# Patient Record
Sex: Male | Born: 1980 | Race: Black or African American | Hispanic: No | Marital: Married | Smoking: Current every day smoker
Health system: Southern US, Community
[De-identification: ages and names within clinical notes are randomized; demographics above are authoritative.]

## PROBLEM LIST (undated history)

## (undated) HISTORY — PX: KNEE SURGERY: SHX244

---

## 2005-09-30 ENCOUNTER — Inpatient Hospital Stay: Payer: Self-pay | Admitting: Unknown Physician Specialty

## 2006-05-07 ENCOUNTER — Emergency Department: Payer: Self-pay | Admitting: Emergency Medicine

## 2006-05-08 ENCOUNTER — Emergency Department: Payer: Self-pay | Admitting: Unknown Physician Specialty

## 2006-08-07 ENCOUNTER — Emergency Department: Payer: Self-pay | Admitting: Unknown Physician Specialty

## 2007-05-14 ENCOUNTER — Emergency Department: Payer: Self-pay | Admitting: Emergency Medicine

## 2008-11-30 ENCOUNTER — Emergency Department: Payer: Self-pay | Admitting: Emergency Medicine

## 2010-01-14 ENCOUNTER — Emergency Department (HOSPITAL_COMMUNITY): Admission: EM | Admit: 2010-01-14 | Discharge: 2010-01-15 | Payer: Self-pay | Admitting: Emergency Medicine

## 2010-01-15 ENCOUNTER — Encounter: Payer: Self-pay | Admitting: Orthopedic Surgery

## 2010-01-30 ENCOUNTER — Encounter (INDEPENDENT_AMBULATORY_CARE_PROVIDER_SITE_OTHER): Payer: Self-pay | Admitting: *Deleted

## 2010-03-18 ENCOUNTER — Ambulatory Visit: Payer: Self-pay | Admitting: Orthopedic Surgery

## 2010-03-18 DIAGNOSIS — M24469 Recurrent dislocation, unspecified knee: Secondary | ICD-10-CM

## 2010-03-18 DIAGNOSIS — S83509A Sprain of unspecified cruciate ligament of unspecified knee, initial encounter: Secondary | ICD-10-CM

## 2010-03-18 DIAGNOSIS — M23302 Other meniscus derangements, unspecified lateral meniscus, unspecified knee: Secondary | ICD-10-CM

## 2010-03-19 ENCOUNTER — Encounter: Payer: Self-pay | Admitting: Orthopedic Surgery

## 2010-03-20 ENCOUNTER — Ambulatory Visit (HOSPITAL_COMMUNITY): Admission: RE | Admit: 2010-03-20 | Discharge: 2010-03-20 | Payer: Self-pay | Admitting: Orthopedic Surgery

## 2010-03-27 ENCOUNTER — Ambulatory Visit: Payer: Self-pay | Admitting: Orthopedic Surgery

## 2010-03-27 DIAGNOSIS — IMO0002 Reserved for concepts with insufficient information to code with codable children: Secondary | ICD-10-CM

## 2010-03-27 DIAGNOSIS — S83289A Other tear of lateral meniscus, current injury, unspecified knee, initial encounter: Secondary | ICD-10-CM

## 2010-04-26 ENCOUNTER — Ambulatory Visit (HOSPITAL_COMMUNITY): Admission: RE | Admit: 2010-04-26 | Discharge: 2010-04-26 | Payer: Self-pay | Admitting: Orthopedic Surgery

## 2010-04-26 ENCOUNTER — Ambulatory Visit: Payer: Self-pay | Admitting: Orthopedic Surgery

## 2010-04-30 ENCOUNTER — Ambulatory Visit: Payer: Self-pay | Admitting: Orthopedic Surgery

## 2010-05-01 ENCOUNTER — Encounter (HOSPITAL_COMMUNITY): Admission: RE | Admit: 2010-05-01 | Discharge: 2010-05-31 | Payer: Self-pay | Admitting: Orthopedic Surgery

## 2010-05-01 ENCOUNTER — Encounter: Payer: Self-pay | Admitting: Orthopedic Surgery

## 2010-05-09 ENCOUNTER — Ambulatory Visit: Payer: Self-pay | Admitting: Orthopedic Surgery

## 2010-05-14 ENCOUNTER — Encounter: Payer: Self-pay | Admitting: Orthopedic Surgery

## 2010-06-06 ENCOUNTER — Ambulatory Visit: Payer: Self-pay | Admitting: Orthopedic Surgery

## 2010-06-10 ENCOUNTER — Encounter (HOSPITAL_COMMUNITY)
Admission: RE | Admit: 2010-06-10 | Discharge: 2010-07-10 | Payer: Self-pay | Source: Home / Self Care | Admitting: Orthopedic Surgery

## 2010-06-27 ENCOUNTER — Encounter: Payer: Self-pay | Admitting: Orthopedic Surgery

## 2010-07-11 ENCOUNTER — Encounter (HOSPITAL_COMMUNITY)
Admission: RE | Admit: 2010-07-11 | Discharge: 2010-08-10 | Payer: Self-pay | Source: Home / Self Care | Admitting: Orthopedic Surgery

## 2010-07-17 ENCOUNTER — Encounter: Payer: Self-pay | Admitting: Orthopedic Surgery

## 2010-07-18 ENCOUNTER — Ambulatory Visit: Payer: Self-pay | Admitting: Orthopedic Surgery

## 2010-07-21 ENCOUNTER — Emergency Department (HOSPITAL_COMMUNITY): Admission: EM | Admit: 2010-07-21 | Discharge: 2010-07-21 | Payer: Self-pay | Admitting: Emergency Medicine

## 2010-08-14 ENCOUNTER — Emergency Department (HOSPITAL_COMMUNITY)
Admission: EM | Admit: 2010-08-14 | Discharge: 2010-08-14 | Payer: Self-pay | Source: Home / Self Care | Admitting: Emergency Medicine

## 2010-08-15 ENCOUNTER — Encounter (HOSPITAL_COMMUNITY)
Admission: RE | Admit: 2010-08-15 | Discharge: 2010-09-14 | Payer: Self-pay | Source: Home / Self Care | Attending: Orthopedic Surgery | Admitting: Orthopedic Surgery

## 2010-08-18 ENCOUNTER — Encounter: Payer: Self-pay | Admitting: Orthopedic Surgery

## 2010-09-12 ENCOUNTER — Emergency Department (HOSPITAL_COMMUNITY)
Admission: EM | Admit: 2010-09-12 | Discharge: 2010-09-13 | Payer: Self-pay | Source: Home / Self Care | Admitting: Emergency Medicine

## 2010-09-12 LAB — RAPID STREP SCREEN (MED CTR MEBANE ONLY): Streptococcus, Group A Screen (Direct): NEGATIVE

## 2010-10-02 ENCOUNTER — Ambulatory Visit
Admission: RE | Admit: 2010-10-02 | Discharge: 2010-10-02 | Payer: Self-pay | Source: Home / Self Care | Attending: Orthopedic Surgery | Admitting: Orthopedic Surgery

## 2010-10-02 ENCOUNTER — Encounter: Payer: Self-pay | Admitting: Orthopedic Surgery

## 2010-10-08 NOTE — Miscellaneous (Signed)
Summary: PT Initial eval  PT Initial eval   Imported By: Cammie Sickle 05/21/2010 19:21:15  _____________________________________________________________________  External Attachment:    Type:   Image     Comment:   External Document

## 2010-10-08 NOTE — Miscellaneous (Signed)
Summary: Rehab Report  Rehab Report   Imported By: Cammie Sickle 08/02/2010 14:07:54  _____________________________________________________________________  External Attachment:    Type:   Image     Comment:   External Document

## 2010-10-08 NOTE — Assessment & Plan Note (Signed)
Summary: POST OP/RE-CHECK/ACL SURG 04/26/10/[facility] DISC/[facility]F   Visit Type:  Follow-up Referring Matthew Baxter:  self Primary Matthew Baxter:  NA  CC:  recheck post op knee.  History of Present Illness: I saw Matthew Baxter in the office today for a followup visit.  He is a 30 years old man with the complaint of:  post op #3, left knee  DOD 04-26-10.  Left ACL reconstruction with a bone patellar tendon bone autograft; partial lateral meniscectomy, posterior horn; abrasion medial meniscal tear which was stable; and a chondroplasty of the medial femoral condyle.  6 week postop visit Medications: Norco 10/325, Phenergan 25 mg and Ibuprofen 800 mg.  Today is 4 week recheck after PT with xrays.  PT eval in EMR.  overall physical therapy doing well despite missing a couple of days.  He does have some pain at night when he is at rest otherwise he is doing well he says he is happy with his knee.  He is postop brace on range 0-90.  X-rays are done today 2 views and fixation appears to be intact the tunnels appear to be in good position. Impression normal postoperative appearance of the LEFT ACL reconstruction  Plan patient can remove his brace is not to play any sports he should continue his physical therapy and follow with me in 6 weeks     Allergies: 1)  ! Penicillin   Impression & Recommendations:  Problem # 1:  AFTERCARE FOLLOW SURGERY MUSCULOSKEL SYSTEM NEC (ICD-V58.78)  Orders: Post-Op Check (29562)  Problem # 2:  TEAR A C L (ICD-844.2)  Orders: Post-Op Check (13086)  Patient Instructions: 1)  6 weeks follow up  2)  brace optional  3)  No sports

## 2010-10-08 NOTE — Letter (Signed)
Summary: Patient medication dischaarge instructions  Patient medication dischaarge instructions   Imported By: Jacklynn Ganong 05/02/2010 15:42:44  _____________________________________________________________________  External Attachment:    Type:   Image     Comment:   External Document

## 2010-10-08 NOTE — Assessment & Plan Note (Signed)
Summary: POST OP 1/LT KNEE ACL SURG 04/26/10/[facility] DISCOUNT/[facility]F   Visit Type:  post op Referring Provider:  self Primary Provider:  NA  CC:  left knee post op.  History of Present Illness: I saw Matthew Baxter in the office today for a followup visit.  He is a 30 years old man with the complaint of:  post op #1, left knee.  DOD 04-26-10.  Left ACL reconstruction with a bone patellar tendon bone autograft; partial lateral meniscectomy, posterior horn; abrasion medial meniscal tear which was stable; and a chondroplasty of the medial femoral condyle.  Medications: Norco 10/325, Phenergan 25 mg and Ibuprofen 800 mg.  Wounds look good   minimal swelling  T scope brace applied     Allergies: 1)  ! Penicillin   Impression & Recommendations:  Problem # 1:  TEAR MEDIAL MENISCUS (ICD-836.0) Assessment Comment Only  Orders: Post-Op Check (16109)  Problem # 2:  TEAR LATERAL MENISCUS (ICD-836.1) Assessment: Comment Only  Orders: Post-Op Check (60454)  Problem # 3:  TEAR A C L (ICD-844.2) Assessment: Comment Only  Orders: Post-Op Check (09811)  Patient Instructions: 1)  start therapy  2)  return  Sept 1 staples out

## 2010-10-08 NOTE — Letter (Signed)
Summary: surgery order LT knee  surgery order LT knee   Imported By: Cammie Sickle 04/02/2010 18:17:37  _____________________________________________________________________  External Attachment:    Type:   Image     Comment:   External Document

## 2010-10-08 NOTE — Miscellaneous (Signed)
Summary: PT clinical evaluation  PT clinical evaluation   Imported By: Jacklynn Ganong 05/17/2010 10:20:30  _____________________________________________________________________  External Attachment:    Type:   Image     Comment:   External Document

## 2010-10-08 NOTE — Assessment & Plan Note (Signed)
Summary: POST OP 2/REMOVE STAPLES/SURG 04/26/10/[facility]C/[facility]F   Visit Type:  post op Referring Provider:  self Primary Provider:  NA  CC:  left knee.  History of Present Illness: I saw Matthew Baxter in the office today for a followup visit.  He is a 30 years old man with the complaint of:  post op #2, left knee  Staples out today.  DOD 04-26-10.  Left ACL reconstruction with a bone patellar tendon bone autograft; partial lateral meniscectomy, posterior horn; abrasion medial meniscal tear which was stable; and a chondroplasty of the medial femoral condyle.  Medications: Norco 10/325, Phenergan 25 mg and Ibuprofen 800 mg.  Staples were removed. Incision looks good. He has 0-90 of flexion.  Brace reapplied. Patient to continue therapy. Followup in 4 weeks for 1st postopAcl xrays   Allergies: 1)  ! Penicillin   Impression & Recommendations:  Problem # 1:  TEAR MEDIAL MENISCUS (ICD-836.0)  Orders: Post-Op Check (13086)  Problem # 2:  TEAR LATERAL MENISCUS (ICD-836.1)  Orders: Post-Op Check (57846)  Problem # 3:  TEAR A C L (ICD-844.2)  Orders: Post-Op Check (96295)  Problem # 4:  AFTERCARE FOLLOW SURGERY MUSCULOSKEL SYSTEM NEC (ICD-V58.78) Assessment: Improved  Orders: Post-Op Check (28413)  Patient Instructions: 1)  4 weeks come back  2)  continue physical therapy  3)  continue brace until you come back

## 2010-10-08 NOTE — Letter (Signed)
Summary: *Orthopedic No Show Letter  [facility]  [address]  [place], [place] [address]   Phone: [phone]  Fax: [phone]      01/30/2010    Mr. Devontae Casasola [address] [place], [place]  [address]    Dear Mr. KANAAN,   Our records indicate that you missed your scheduled appointment with Dr. Beaulah Corin on 01/28/10, following your Emergency Room visit at [facility].   Please contact this office to reschedule your appointment as soon as possible.  It is important that you keep your scheduled appointments with your physician, so we can provide you the best care possible.        Sincerely,   Dr. Terrance Mass, MD [facility] Phone [phone]

## 2010-10-08 NOTE — Assessment & Plan Note (Signed)
Summary: 6 wek reck/post op acl surg 04/26/10/[facility] disc/bsf   Visit Type:  Follow-up Referring Provider:  self Primary Provider:  NA   History of Present Illness: I saw Matthew Baxter in the office today for a 6 week followup visit.  He is a 30 years old man with the complaint of:  increased left knee pain   DOD 04-26-10.  Left ACL reconstruction with a bone patellar tendon bone autograft; partial lateral meniscectomy, posterior horn; abrasion medial meniscal tear which was stable; and a chondroplasty of the medial femoral condyle.  11 6/7days week post op visit   Medications: Norco 10/325, Phenergan 25 mg   He had 2 episodes of knee popping one while in bed going from flexion to extension; and the other walking in the [place]   The therapy is progressing on target   Allergies: 1)  ! Penicillin   Knee Exam  Knee Exam:    Left:    Inspection:  Normal    Palpation:  Normal    Tenderness:  no    Swelling:  no    Range of Motion:       Flexion-Active: full       Extension-Active: full  Anterior drawer:    Left negative Lachman :    Left negative Pivot:    Left negative  McMurrays negative left knee   Impression & Recommendations:  Problem # 1:  AFTERCARE FOLLOW SURGERY MUSCULOSKEL SYSTEM NEC (ICD-V58.78) Assessment Comment Only  Orders: Post-Op Check (64332)  Problem # 2:  TEAR MEDIAL MENISCUS (ICD-836.0) Assessment: Comment Only  Orders: Post-Op Check (95188)  Problem # 3:  TEAR LATERAL MENISCUS (ICD-836.1) Assessment: Comment Only  The knee seems fine. Perhaps there may be some scar tissue that popped.   Orders: Post-Op Check [phone])  Medications Added to Medication List This Visit: 1)  Norco 7.5-325 Mg Tabs (Hydrocodone-acetaminophen) .Marland Kitchen.. 1 q 6 as needed pain 2)  Promethazine Hcl 25 Mg Tabs (Promethazine hcl) .Marland Kitchen.. 1 by mouth q 6 as needed nausea  Patient Instructions: 1)  Please schedule a follow-up appointment in 3 months. Prescriptions: PROMETHAZINE  HCL 25 MG TABS (PROMETHAZINE HCL) 1 by mouth q 6 as needed nausea  #56 x 2   Entered and Authorized by:   Fuller Canada MD   Signed by:   Fuller Canada MD on 07/18/2010   Method used:   Print then Give to Patient   RxID:   [id number] NORCO 7.5-325 MG TABS (HYDROCODONE-ACETAMINOPHEN) 1 q 6 as needed pain  #56 x 2   Entered and Authorized by:   Fuller Canada MD   Signed by:   Fuller Canada MD on 07/18/2010   Method used:   Print then Give to Patient   RxID:   [id number]    Orders Added: 1)  Post-Op Check [31517]
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
## 2010-10-08 NOTE — Miscellaneous (Signed)
Summary: mri appt 03/20/10 reg at 230pm  Clinical Lists Changes  has [facility] percent [facility], no precert needed, LMOM of appt, advised to call here to make appt to go over results. Patient has scheduled appt for 03/27/10.

## 2010-10-08 NOTE — Letter (Signed)
Summary: Out of [facility]  [address]  [place], [place] [address]   Phone: [phone]  Fax: [phone]    April 30, 2010   Patient:  Matthew Baxter    To Whom It May Concern:   For Medical reasons, please Note:  Apppointment in our office today/may return to school  Start:   April 30, 2010   End:    April 30, 2010  If you need additional information, please feel free to contact our office.   Sincerely,    Terrance Mass, MD    ****This is a legal document and cannot be tampered with.  Schools are authorized to verify all information and to do so accordingly.

## 2010-10-08 NOTE — Letter (Signed)
Summary: History form  History form   Imported By: Jacklynn Ganong 03/25/2010 07:57:32  _____________________________________________________________________  External Attachment:    Type:   Image     Comment:   External Document
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
## 2010-10-08 NOTE — Miscellaneous (Signed)
Summary: Rehab Report  Rehab Report   Imported By: Jacklynn Ganong 07/03/2010 15:02:35  _____________________________________________________________________  External Attachment:    Type:   Image     Comment:   External Document

## 2010-10-10 NOTE — Miscellaneous (Signed)
Summary: Rehab Report  Rehab Report   Imported By: Cammie Sickle 09/10/2010 17:51:50  _____________________________________________________________________  External Attachment:    Type:   Image     Comment:   External Document

## 2010-10-10 NOTE — Assessment & Plan Note (Signed)
Summary: 4 M RE-CK LT KNEE/POST OP ACL SURG 04/26/10/[facility] DISCOUNT/CAF   Visit Type:  Follow-up Referring Provider:  self Primary Provider:  NA  CC:  recheck left knee.  History of Present Illness:   30 year old male, status post LEFT ACL reconstruction. This is postop month #5.  Primary complaint is popping at about 30 of flexion going into extension.  DOD 04-26-10.  Left ACL reconstruction with a bone patellar tendon bone autograft; partial lateral meniscectomy, posterior horn; abrasion medial meniscal tear which was stable; and a chondroplasty of the medial femoral condyle.  Medications: Norco 7.5, 2 in am, 2 at night, needs refill./also takes ibuprofen   He does not have any swelling or stiffness. As a pain level of 2/10. He is working as a Curator and a Glass blower/designer.         Allergies: 1)  ! Penicillin  Physical Exam  Additional Exam:  LEFT knee. Mild joint effusion is noted. Incision has healed nicely. He does have some lateral incisional decreased sensation.  His range of motion is full. His Lachman test is grade 0. Collateral ligaments are stable. McMurray sign was negative. Quadriceps strength is normal. He does come to full extension. Pulses and perfusion are normal.  He walks without a limp.   Impression & Recommendations:  Problem # 1:  TEAR A C L (ICD-844.2) Assessment Comment Only  Orders: Est. Patient Level III (04540)  Problem # 2:  TEAR MEDIAL MENISCUS (ICD-836.0) Assessment: Comment Only  Orders: Est. Patient Level III (98119)  Problem # 3:  TEAR LATERAL MENISCUS (ICD-836.1) Assessment: Comment Only  Orders: Est. Patient Level III (14782)  He is doing well continue as he is doing but take ibuprofen 3 times a day, and Norco 5 mg q.6 as needed for pain   Medications Added to Medication List This Visit: 1)  Norco 5-325 Mg Tabs (Hydrocodone-acetaminophen) .Marland Kitchen.. 1 by mouth q 6 hrs as needed pain 2)  Ibuprofen 800 Mg Tabs (Ibuprofen)  .Marland Kitchen.. 1 by mouth tid  Patient Instructions: 1)  Take ibuprofen 800 mg three times a day  2)  Take norco 5 mg q 6 hrs as needed  3)  f/u 3 months  Prescriptions: IBUPROFEN 800 MG TABS (IBUPROFEN) 1 by mouth tid  #90 x 2   Entered and Authorized by:   Fuller Canada MD   Signed by:   Fuller Canada MD on 10/02/2010   Method used:   Print then Give to Patient   RxID:   [id number] NORCO 5-325 MG TABS (HYDROCODONE-ACETAMINOPHEN) 1 by mouth q 6 hrs as needed pain  #84 x 2   Entered and Authorized by:   Fuller Canada MD   Signed by:   Fuller Canada MD on 10/02/2010   Method used:   Print then Give to Patient   RxID:   [id number]    Orders Added: 1)  Est. Patient Level III [25366]

## 2010-10-10 NOTE — Letter (Signed)
Summary: Out of Work  [facility]  [address]  [place], [place] [address]   Phone: [phone]  Fax: [phone]    October 02, 2010   Employee:  Matthew Baxter    To Whom It May Concern:   For Medical reasons, please excuse the above named employee from work for the following dates:  Start:   10/02/10 - patient/employee seen in our office for an appointment this morning  End/Return to work full duty no restrictions:   10/02/10  If you need additional information, please feel free to contact our office.         Sincerely,    Terrance Mass, MD

## 2010-11-22 LAB — SURGICAL PCR SCREEN
MRSA, PCR: NEGATIVE
Staphylococcus aureus: POSITIVE — AB

## 2010-11-22 LAB — HEMOGLOBIN AND HEMATOCRIT, BLOOD
HCT: 43.8 % (ref 39.0–52.0)
Hemoglobin: 14.9 g/dL (ref 13.0–17.0)

## 2010-12-24 ENCOUNTER — Emergency Department (HOSPITAL_COMMUNITY)
Admission: EM | Admit: 2010-12-24 | Discharge: 2010-12-24 | Disposition: A | Discharge status: Home / Self Care | Payer: Self-pay | Attending: Emergency Medicine | Admitting: Emergency Medicine

## 2010-12-24 DIAGNOSIS — S81009A Unspecified open wound, unspecified knee, initial encounter: Secondary | ICD-10-CM | POA: Insufficient documentation

## 2010-12-24 DIAGNOSIS — W268XXA Contact with other sharp object(s), not elsewhere classified, initial encounter: Secondary | ICD-10-CM | POA: Insufficient documentation

## 2010-12-24 DIAGNOSIS — Y9269 Other specified industrial and construction area as the place of occurrence of the external cause: Secondary | ICD-10-CM | POA: Insufficient documentation

## 2011-01-01 ENCOUNTER — Encounter: Payer: Self-pay | Admitting: Orthopedic Surgery

## 2011-01-01 ENCOUNTER — Ambulatory Visit: Payer: Self-pay | Admitting: Orthopedic Surgery

## 2011-01-07 ENCOUNTER — Emergency Department (HOSPITAL_COMMUNITY)
Admission: EM | Admit: 2011-01-07 | Discharge: 2011-01-07 | Disposition: A | Discharge status: Home / Self Care | Payer: Self-pay | Attending: Emergency Medicine | Admitting: Emergency Medicine

## 2011-01-07 DIAGNOSIS — F17200 Nicotine dependence, unspecified, uncomplicated: Secondary | ICD-10-CM | POA: Insufficient documentation

## 2011-01-07 DIAGNOSIS — Z4802 Encounter for removal of sutures: Secondary | ICD-10-CM | POA: Insufficient documentation

## 2011-02-21 ENCOUNTER — Emergency Department (HOSPITAL_COMMUNITY): Payer: Self-pay

## 2011-02-21 ENCOUNTER — Emergency Department (HOSPITAL_COMMUNITY)
Admission: EM | Admit: 2011-02-21 | Discharge: 2011-02-21 | Disposition: A | Discharge status: Home / Self Care | Payer: Self-pay | Attending: Emergency Medicine | Admitting: Emergency Medicine

## 2011-02-21 DIAGNOSIS — W230XXA Caught, crushed, jammed, or pinched between moving objects, initial encounter: Secondary | ICD-10-CM | POA: Insufficient documentation

## 2011-02-21 DIAGNOSIS — S61209A Unspecified open wound of unspecified finger without damage to nail, initial encounter: Secondary | ICD-10-CM | POA: Insufficient documentation

## 2011-03-11 ENCOUNTER — Emergency Department (HOSPITAL_COMMUNITY)
Admission: EM | Admit: 2011-03-11 | Discharge: 2011-03-11 | Disposition: A | Discharge status: Home / Self Care | Payer: Self-pay | Attending: Emergency Medicine | Admitting: Emergency Medicine

## 2011-03-11 DIAGNOSIS — F1210 Cannabis abuse, uncomplicated: Secondary | ICD-10-CM | POA: Insufficient documentation

## 2011-03-11 DIAGNOSIS — T63461A Toxic effect of venom of wasps, accidental (unintentional), initial encounter: Secondary | ICD-10-CM | POA: Insufficient documentation

## 2011-03-11 DIAGNOSIS — R221 Localized swelling, mass and lump, neck: Secondary | ICD-10-CM | POA: Insufficient documentation

## 2011-03-11 DIAGNOSIS — T6391XA Toxic effect of contact with unspecified venomous animal, accidental (unintentional), initial encounter: Secondary | ICD-10-CM | POA: Insufficient documentation

## 2011-03-11 DIAGNOSIS — R220 Localized swelling, mass and lump, head: Secondary | ICD-10-CM | POA: Insufficient documentation

## 2012-01-09 ENCOUNTER — Emergency Department: Payer: Self-pay | Admitting: Internal Medicine

## 2012-04-02 ENCOUNTER — Emergency Department (HOSPITAL_COMMUNITY)
Admission: EM | Admit: 2012-04-02 | Discharge: 2012-04-02 | Disposition: A | Discharge status: Home / Self Care | Payer: Self-pay | Attending: Emergency Medicine | Admitting: Emergency Medicine

## 2012-04-02 ENCOUNTER — Encounter (HOSPITAL_COMMUNITY): Payer: Self-pay

## 2012-04-02 DIAGNOSIS — F17200 Nicotine dependence, unspecified, uncomplicated: Secondary | ICD-10-CM | POA: Insufficient documentation

## 2012-04-02 DIAGNOSIS — L259 Unspecified contact dermatitis, unspecified cause: Secondary | ICD-10-CM | POA: Insufficient documentation

## 2012-04-02 MED ORDER — PREDNISONE 10 MG PO TABS: ORAL_TABLET | ORAL | Status: DC

## 2012-04-02 MED ORDER — PREDNISONE 20 MG PO TABS
60.0000 mg | ORAL_TABLET | Freq: Once | ORAL | Status: AC
  Administered 2012-04-02: 60 mg via ORAL
  Filled 2012-04-02: qty 3

## 2012-04-02 MED ORDER — DIPHENHYDRAMINE HCL 25 MG PO CAPS
50.0000 mg | ORAL_CAPSULE | Freq: Once | ORAL | Status: AC
  Administered 2012-04-02: 50 mg via ORAL
  Filled 2012-04-02: qty 2

## 2012-04-02 NOTE — ED Provider Notes (Signed)
History     CSN: [id number]  Arrival date & time 04/02/12  1611   First MD Initiated Contact with Patient 04/02/12 1645      Chief Complaint  Patient presents with  . 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  He and a coworker have both developed an itchy rash which first started on his extremities,  But has now spread to his penis (which he blames on not taking his work gloves off to urinate).  Rash is both itchy,  But also is burning.  He denies fevers or chills,  No shortness of breath,  Facial swelling or wheezing.  He has tried no medications prior to arrival.  Patient is a 31 y.o. male presenting with rash. The history is provided by the patient.  Rash  This is a new problem. The current episode started more than 2 days ago. The problem has been gradually worsening. The problem is associated with plant contact. There has been no fever. The rash is present on the right arm, right upper leg, right lower leg, left upper leg, left lower leg, groin and left arm. The pain is moderate. The pain has been constant since onset. Associated symptoms include itching. Pertinent negatives include no blisters and no weeping. He has tried nothing for the symptoms.    History reviewed. No pertinent past medical history.  History reviewed. No pertinent past surgical history.  Family History  Problem Relation Age of Onset  . Arthritis      FH   . Diabetes      FH    History  Substance Use Topics  . Smoking status: Current Everyday Smoker    Types: Cigarettes  . Smokeless tobacco: Not on file  . Alcohol Use: Yes      Review of Systems  Constitutional: Negative for fever and chills.  HENT: Negative for facial swelling.   Respiratory: Negative for shortness of breath and wheezing.   Skin: Positive for itching and rash.    Neurological: Negative for numbness.    Allergies  Penicillins and Bee venom  Home Medications   Current Outpatient Rx  Name Route Sig Dispense Refill  . PREDNISONE 10 MG PO TABS  Take 6 tabs daily by mouth for 2 day,  Then 5 tabs daily for 2 days,  4 tabs daily for 2 days,  3 tabs daily for 2 days,  2 tabs daily for 2 days,  Then 1 tab daily for 2 days. 42 tablet 0    BP 117/71  Pulse 98  Temp 98.2 F (36.8 C) (Oral)  Resp 20  SpO2 98%  Physical Exam  Constitutional: He appears well-developed and well-nourished. No distress.  HENT:  Head: Normocephalic.  Neck: Neck supple.  Cardiovascular: Normal rate.   Pulmonary/Chest: Effort normal. He has no wheezes.  Musculoskeletal: Normal range of motion. He exhibits no edema.  Skin: Rash noted. Rash is vesicular.       Tiny scattered patches of vesicles with excoriations scattered on upper anterior thighs,  Forearms and shaft of penis.  No active draining lesions.  No erythema, no pustules.    ED Course  Procedures (including critical care time)  Labs Reviewed - No data to display No results found.   1. Contact dermatitis       MDM  Prednisone 12 day taper.  Benadryl.  Topical anti - itch cream.  Recheck if not improving over the next several days.  Avoid direct skin contact with plants.        Burgess Amor, [place] 04/02/12 1752

## 2012-04-02 NOTE — ED Notes (Signed)
Pt reports a rash to his groin area.  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

## 2012-04-02 NOTE — ED Provider Notes (Signed)
Medical screening examination/treatment/procedure(s) were performed by non-physician practitioner and as supervising physician I was immediately available for consultation/collaboration.  Geoffery Lyons, MD 04/02/12 1900

## 2012-04-02 NOTE — ED Notes (Signed)
Pt now with rash and irritation to head of penis, c/o pain when any clothing touches it

## 2013-05-09 ENCOUNTER — Encounter (HOSPITAL_COMMUNITY): Payer: Self-pay | Admitting: *Deleted

## 2013-05-09 ENCOUNTER — Emergency Department (HOSPITAL_COMMUNITY): Payer: Self-pay

## 2013-05-09 ENCOUNTER — Emergency Department (HOSPITAL_COMMUNITY)
Admission: EM | Admit: 2013-05-09 | Discharge: 2013-05-09 | Disposition: A | Discharge status: Home / Self Care | Payer: Self-pay | Attending: Emergency Medicine | Admitting: Emergency Medicine

## 2013-05-09 DIAGNOSIS — H11422 Conjunctival edema, left eye: Secondary | ICD-10-CM

## 2013-05-09 DIAGNOSIS — S0510XA Contusion of eyeball and orbital tissues, unspecified eye, initial encounter: Secondary | ICD-10-CM | POA: Insufficient documentation

## 2013-05-09 DIAGNOSIS — S0512XA Contusion of eyeball and orbital tissues, left eye, initial encounter: Secondary | ICD-10-CM

## 2013-05-09 DIAGNOSIS — F17200 Nicotine dependence, unspecified, uncomplicated: Secondary | ICD-10-CM | POA: Insufficient documentation

## 2013-05-09 DIAGNOSIS — H11429 Conjunctival edema, unspecified eye: Secondary | ICD-10-CM | POA: Insufficient documentation

## 2013-05-09 DIAGNOSIS — H1132 Conjunctival hemorrhage, left eye: Secondary | ICD-10-CM

## 2013-05-09 DIAGNOSIS — Z880 Allergy status to penicillin: Secondary | ICD-10-CM | POA: Insufficient documentation

## 2013-05-09 DIAGNOSIS — H209 Unspecified iridocyclitis: Secondary | ICD-10-CM | POA: Insufficient documentation

## 2013-05-09 DIAGNOSIS — S0990XA Unspecified injury of head, initial encounter: Secondary | ICD-10-CM | POA: Insufficient documentation

## 2013-05-09 DIAGNOSIS — H1130 Conjunctival hemorrhage, unspecified eye: Secondary | ICD-10-CM | POA: Insufficient documentation

## 2013-05-09 MED ORDER — CLINDAMYCIN HCL 300 MG PO CAPS: 300.0000 mg | ORAL_CAPSULE | Freq: Four times a day (QID) | ORAL | Status: DC

## 2013-05-09 MED ORDER — CYCLOPENTOLATE-PHENYLEPHRINE 0.2-1 % OP SOLN
OPHTHALMIC | Status: AC
  Filled 2013-05-09: qty 2

## 2013-05-09 MED ORDER — CLINDAMYCIN HCL 150 MG PO CAPS
300.0000 mg | ORAL_CAPSULE | Freq: Once | ORAL | Status: AC
  Administered 2013-05-09: 300 mg via ORAL
  Filled 2013-05-09: qty 2

## 2013-05-09 MED ORDER — FLUORESCEIN SODIUM 1 MG OP STRP
ORAL_STRIP | OPHTHALMIC | Status: AC
  Filled 2013-05-09: qty 1

## 2013-05-09 MED ORDER — CYCLOPENTOLATE HCL 1 % OP SOLN
2.0000 [drp] | Freq: Once | OPHTHALMIC | Status: AC
  Administered 2013-05-09: 2 [drp] via OPHTHALMIC
  Filled 2013-05-09: qty 2

## 2013-05-09 MED ORDER — OXYCODONE-ACETAMINOPHEN 5-325 MG PO TABS
1.0000 | ORAL_TABLET | Freq: Once | ORAL | Status: AC
  Administered 2013-05-09: 1 via ORAL
  Filled 2013-05-09: qty 1

## 2013-05-09 MED ORDER — TETRACAINE HCL 0.5 % OP SOLN
OPHTHALMIC | Status: AC
  Filled 2013-05-09: qty 2

## 2013-05-09 MED ORDER — OXYCODONE-ACETAMINOPHEN 5-325 MG PO TABS: 1.0000 | ORAL_TABLET | ORAL | Status: DC | PRN

## 2013-05-09 MED ORDER — TETANUS-DIPHTH-ACELL PERTUSSIS 5-2.5-18.5 LF-MCG/0.5 IM SUSP
0.5000 mL | Freq: Once | INTRAMUSCULAR | Status: DC
  Filled 2013-05-09: qty 0.5

## 2013-05-09 NOTE — ED Notes (Signed)
nad noted prior to dc. Dc instructions reviewed and explained. 2 scripts given to pt prior to dc home. Pt voiced understanding of f/u if needed.
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
## 2013-05-09 NOTE — ED Notes (Signed)
Patient transported to CT 

## 2013-05-09 NOTE — ED Notes (Signed)
Pt back from CT

## 2013-05-09 NOTE — ED Provider Notes (Signed)
IOP at 22 Pt well appearing, no corneal hazing Stable for d/c  Joya Gaskins, MD 05/09/13 2016
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
## 2013-05-09 NOTE — ED Notes (Signed)
Called. Olegario Messier to receive eye drop

## 2014-08-19 ENCOUNTER — Emergency Department (HOSPITAL_COMMUNITY)
Admission: EM | Admit: 2014-08-19 | Discharge: 2014-08-19 | Disposition: A | Discharge status: Home / Self Care | Payer: Self-pay | Attending: Emergency Medicine | Admitting: Emergency Medicine

## 2014-08-19 ENCOUNTER — Emergency Department (HOSPITAL_COMMUNITY): Payer: Self-pay

## 2014-08-19 ENCOUNTER — Encounter (HOSPITAL_COMMUNITY): Payer: Self-pay | Admitting: Emergency Medicine

## 2014-08-19 DIAGNOSIS — R509 Fever, unspecified: Secondary | ICD-10-CM

## 2014-08-19 DIAGNOSIS — J020 Streptococcal pharyngitis: Secondary | ICD-10-CM | POA: Insufficient documentation

## 2014-08-19 DIAGNOSIS — Z87891 Personal history of nicotine dependence: Secondary | ICD-10-CM | POA: Insufficient documentation

## 2014-08-19 DIAGNOSIS — M791 Myalgia: Secondary | ICD-10-CM | POA: Insufficient documentation

## 2014-08-19 DIAGNOSIS — Z880 Allergy status to penicillin: Secondary | ICD-10-CM | POA: Insufficient documentation

## 2014-08-19 DIAGNOSIS — R059 Cough, unspecified: Secondary | ICD-10-CM

## 2014-08-19 DIAGNOSIS — R05 Cough: Secondary | ICD-10-CM

## 2014-08-19 DIAGNOSIS — E860 Dehydration: Secondary | ICD-10-CM | POA: Insufficient documentation

## 2014-08-19 DIAGNOSIS — R000 Tachycardia, unspecified: Secondary | ICD-10-CM | POA: Insufficient documentation

## 2014-08-19 LAB — CBC WITH DIFFERENTIAL/PLATELET
Basophils Absolute: 0 10*3/uL (ref 0.0–0.1)
Basophils Relative: 0 % (ref 0–1)
Eosinophils Absolute: 0 10*3/uL (ref 0.0–0.7)
Eosinophils Relative: 0 % (ref 0–5)
HCT: 39.7 % (ref 39.0–52.0)
Hemoglobin: 13.7 g/dL (ref 13.0–17.0)
Lymphocytes Relative: 13 % (ref 12–46)
Lymphs Abs: 1.9 10*3/uL (ref 0.7–4.0)
MCH: 31.1 pg (ref 26.0–34.0)
MCHC: 34.5 g/dL (ref 30.0–36.0)
MCV: 90.2 fL (ref 78.0–100.0)
Monocytes Absolute: 1.9 10*3/uL — ABNORMAL HIGH (ref 0.1–1.0)
Monocytes Relative: 13 % — ABNORMAL HIGH (ref 3–12)
Neutro Abs: 11.1 10*3/uL — ABNORMAL HIGH (ref 1.7–7.7)
Neutrophils Relative %: 74 % (ref 43–77)
Platelets: 165 10*3/uL (ref 150–400)
RBC: 4.4 MIL/uL (ref 4.22–5.81)
RDW: 12.2 % (ref 11.5–15.5)
WBC: 15 10*3/uL — ABNORMAL HIGH (ref 4.0–10.5)

## 2014-08-19 LAB — COMPREHENSIVE METABOLIC PANEL
ALT: 34 U/L (ref 0–53)
AST: 22 U/L (ref 0–37)
Albumin: 3.9 g/dL (ref 3.5–5.2)
Alkaline Phosphatase: 73 U/L (ref 39–117)
Anion gap: 13 (ref 5–15)
BUN: 15 mg/dL (ref 6–23)
CO2: 26 mEq/L (ref 19–32)
Calcium: 9.3 mg/dL (ref 8.4–10.5)
Chloride: 95 mEq/L — ABNORMAL LOW (ref 96–112)
Creatinine, Ser: 1.3 mg/dL (ref 0.50–1.35)
GFR calc Af Amer: 83 mL/min — ABNORMAL LOW (ref >90)
GFR calc non Af Amer: 71 mL/min — ABNORMAL LOW (ref >90)
Glucose, Bld: 110 mg/dL — ABNORMAL HIGH (ref 70–99)
Potassium: 4 mEq/L (ref 3.7–5.3)
Sodium: 134 mEq/L — ABNORMAL LOW (ref 137–147)
Total Bilirubin: 1.3 mg/dL — ABNORMAL HIGH (ref 0.3–1.2)
Total Protein: 8 g/dL (ref 6.0–8.3)

## 2014-08-19 LAB — INFLUENZA PANEL BY PCR (TYPE A & B)
H1N1 flu by pcr: NOT DETECTED
Influenza A By PCR: NEGATIVE
Influenza B By PCR: NEGATIVE

## 2014-08-19 LAB — RAPID STREP SCREEN (MED CTR MEBANE ONLY): Streptococcus, Group A Screen (Direct): POSITIVE — AB

## 2014-08-19 MED ORDER — CLINDAMYCIN HCL 300 MG PO CAPS: 300.0000 mg | ORAL_CAPSULE | Freq: Three times a day (TID) | ORAL | Status: DC

## 2014-08-19 MED ORDER — ACETAMINOPHEN 500 MG PO TABS
1000.0000 mg | ORAL_TABLET | Freq: Once | ORAL | Status: AC
  Administered 2014-08-19: 1000 mg via ORAL

## 2014-08-19 MED ORDER — ACETAMINOPHEN 500 MG PO TABS
ORAL_TABLET | ORAL | Status: AC
  Filled 2014-08-19: qty 2

## 2014-08-19 MED ORDER — ONDANSETRON HCL 4 MG/2ML IJ SOLN
4.0000 mg | Freq: Once | INTRAMUSCULAR | Status: AC
  Administered 2014-08-19: 4 mg via INTRAVENOUS
  Filled 2014-08-19: qty 2

## 2014-08-19 MED ORDER — IBUPROFEN 400 MG PO TABS
600.0000 mg | ORAL_TABLET | Freq: Once | ORAL | Status: AC
  Administered 2014-08-19: 600 mg via ORAL
  Filled 2014-08-19: qty 2

## 2014-08-19 MED ORDER — SODIUM CHLORIDE 0.9 % IV BOLUS (SEPSIS)
2000.0000 mL | Freq: Once | INTRAVENOUS | Status: AC
  Administered 2014-08-19: 2000 mL via INTRAVENOUS

## 2014-08-19 MED ORDER — PROMETHAZINE HCL 25 MG PO TABS: 25.0000 mg | ORAL_TABLET | Freq: Four times a day (QID) | ORAL | Status: DC | PRN

## 2014-08-19 NOTE — ED Notes (Signed)
PT reports sore throat/ body aches/ productive cough/ nausea/ vomiting and fever x3 days. PT has oral temp of 103.1 in triage but denies any OTC meds today.

## 2014-08-19 NOTE — ED Provider Notes (Signed)
CSN: [id number]     Arrival date & time 08/19/14  1528 History   First MD Initiated Contact with Patient 08/19/14 1720     Chief Complaint  Patient presents with  . Fever     HPI Patient reports 3 days of nausea vomiting and productive cough.  He's had myalgias.  He also reports sore throat.  Fevers been as high as 103 as documented in the emergency department.  No medications today.  No urinary complaints.  No recent sick contacts.  Symptoms are mild to moderate in severity   History reviewed. No pertinent past medical history. Past Surgical History  Procedure Laterality Date  . Knee surgery     Family History  Problem Relation Age of Onset  . Arthritis      FH   . Diabetes      FH   History  Substance Use Topics  . Smoking status: Former Smoker    Types: Cigarettes    Quit date: 08/07/2014  . Smokeless tobacco: Not on file  . Alcohol Use: No    Review of Systems  All other systems reviewed and are negative.     Allergies  Penicillins and Bee venom  Home Medications   Prior to Admission medications   Medication Sig Start Date End Date Taking? Authorizing Provider  clindamycin (CLEOCIN) 300 MG capsule Take 1 capsule (300 mg total) by mouth 3 (three) times daily. 08/19/14   Lyanne CoKevin M Kha Hari, MD  oxyCODONE-acetaminophen (PERCOCET/ROXICET) 5-325 MG per tablet Take 1 tablet by mouth every 4 (four) hours as needed for pain. Patient not taking: Reported on 08/19/2014 05/09/13   Joya Gaskinsonald W Wickline, MD  promethazine (PHENERGAN) 25 MG tablet Take 1 tablet (25 mg total) by mouth every 6 (six) hours as needed for nausea or vomiting. 08/19/14   Lyanne CoKevin M Trish Mancinelli, MD   BP 117/83 mmHg  Pulse 124  Temp(Src) 98.8 F (37.1 C) (Oral)  Resp 16  Ht 5\' 5"  (1.651 m)  Wt 200 lb (90.719 kg)  BMI 33.28 kg/m2  SpO2 98% Physical Exam  Constitutional: He is oriented to person, place, and time. He appears well-developed and well-nourished.  HENT:  Head: Normocephalic and atraumatic.   Eyes: EOM are normal.  Neck: Normal range of motion.  Cardiovascular: Regular rhythm, normal heart sounds and intact distal pulses.   Tachycardia  Pulmonary/Chest: Effort normal and breath sounds normal. No respiratory distress.  Abdominal: Soft. He exhibits no distension. There is no tenderness.  Musculoskeletal: Normal range of motion.  Neurological: He is alert and oriented to person, place, and time.  Skin: Skin is warm and dry.  Psychiatric: He has a normal mood and affect. Judgment normal.  Nursing note and vitals reviewed.   ED Course  Procedures (including critical care time) Labs Review Labs Reviewed  RAPID STREP SCREEN - Abnormal; Notable for the following:    Streptococcus, Group A Screen (Direct) POSITIVE (*)    All other components within normal limits  CBC WITH DIFFERENTIAL - Abnormal; Notable for the following:    WBC 15.0 (*)    Neutro Abs 11.1 (*)    Monocytes Relative 13 (*)    Monocytes Absolute 1.9 (*)    All other components within normal limits  COMPREHENSIVE METABOLIC PANEL - Abnormal; Notable for the following:    Sodium 134 (*)    Chloride 95 (*)    Glucose, Bld 110 (*)    Total Bilirubin 1.3 (*)    GFR calc non Af Denyse DagoAmer  71 (*)    GFR calc Af Amer 83 (*)    All other components within normal limits  INFLUENZA PANEL BY PCR (TYPE A & B, H1N1)    Imaging Review Dg Chest 2 View  08/19/2014   CLINICAL DATA:  Cough and fever  EXAM: CHEST  2 VIEW  COMPARISON:  None.  FINDINGS: Normal heart, mediastinum and hila.  Lungs are clear.  No pleural effusion or pneumothorax.  Bony thorax is unremarkable.  IMPRESSION: Normal chest radiographs.   Electronically Signed   By: Amie Portlandavid  Ormond M.D.   On: 08/19/2014 18:31  I personally reviewed the imaging tests through PACS system I reviewed available ER/hospitalization records through the EMR    EKG Interpretation None      MDM   Final diagnoses:  Fever  Cough  Dehydration  Strep pharyngitis    6:51  PM Patient feels much better at this time.  Heart rate improved.  Heart rate 91.  Temp down to 98.8.  Dehydration.  Better after IV fluids.  Positive strep.  Will be treated for strep pharyngitis.  Home and nausea medicine.  Abdomen is benign.    Lyanne CoKevin M Concetta Guion, MD 08/19/14 [phone]

## 2015-03-25 ENCOUNTER — Encounter: Payer: Self-pay | Admitting: Emergency Medicine

## 2015-03-25 DIAGNOSIS — J029 Acute pharyngitis, unspecified: Secondary | ICD-10-CM | POA: Insufficient documentation

## 2015-03-25 DIAGNOSIS — Z792 Long term (current) use of antibiotics: Secondary | ICD-10-CM | POA: Insufficient documentation

## 2015-03-25 DIAGNOSIS — Z880 Allergy status to penicillin: Secondary | ICD-10-CM | POA: Insufficient documentation

## 2015-03-25 DIAGNOSIS — Z720 Tobacco use: Secondary | ICD-10-CM | POA: Insufficient documentation

## 2015-03-25 LAB — POCT RAPID STREP A: Streptococcus, Group A Screen (Direct): NEGATIVE

## 2015-03-25 NOTE — ED Notes (Addendum)
Pt c/o sore throat and headache since Friday; worsening; pain/swelling to the roof of his mouth; pt says he's had strep throat before and this feels similar

## 2015-03-26 ENCOUNTER — Emergency Department
Admission: EM | Admit: 2015-03-26 | Discharge: 2015-03-26 | Disposition: A | Discharge status: Home / Self Care | Payer: Self-pay | Attending: Student | Admitting: Student

## 2015-03-26 DIAGNOSIS — J028 Acute pharyngitis due to other specified organisms: Secondary | ICD-10-CM

## 2015-03-26 DIAGNOSIS — J029 Acute pharyngitis, unspecified: Secondary | ICD-10-CM

## 2015-03-26 DIAGNOSIS — B9789 Other viral agents as the cause of diseases classified elsewhere: Secondary | ICD-10-CM

## 2015-03-26 MED ORDER — KETOROLAC TROMETHAMINE 60 MG/2ML IM SOLN
60.0000 mg | Freq: Once | INTRAMUSCULAR | Status: AC
  Administered 2015-03-26: 60 mg via INTRAMUSCULAR
  Filled 2015-03-26: qty 2

## 2015-03-26 MED ORDER — IBUPROFEN 600 MG PO TABS: 600.0000 mg | ORAL_TABLET | Freq: Four times a day (QID) | ORAL | Status: DC | PRN

## 2015-03-26 MED ORDER — DEXAMETHASONE 1 MG/ML PO CONC
10.0000 mg | Freq: Once | ORAL | Status: AC
  Administered 2015-03-26: 10 mg via ORAL
  Filled 2015-03-26: qty 10

## 2015-03-26 NOTE — ED Notes (Signed)
Pt uprite on stretcher in exam room with no distress noted; pt reports since Friday has had sore throat and generalized HA; st feels similar to previous episode of strep throat; denies any other accomp c/o; resp even/unlab, lungs clear

## 2015-03-26 NOTE — ED Provider Notes (Signed)
[facility] Emergency Department Provider Note  ____________________________________________  Time seen: Approximately 12:33 AM  I have reviewed the triage vital signs and the nursing notes.   HISTORY  Chief Complaint Sore Throat; Mouth Lesions; and Headache    HPI Matthew Baxter is a 34 y.o. male with no chronic medical problems presents for evaluation of 2 days constant worsening sore throat/throat pain and chills. No cough, sneezing, runny nose, congestion. No vomiting or diarrhea. Current severity of symptoms is moderate. Eating/drinking makes the pain worse.   History reviewed. No pertinent past medical history.  Patient Active Problem List   Diagnosis Date Noted  . TEAR MEDIAL MENISCUS 03/27/2010  . TEAR LATERAL MENISCUS 03/27/2010  . DERANGEMENT MENISCUS 03/18/2010  . SUBLUXATION PATELLAR (MALALIGNMENT) 03/18/2010  . TEAR A C L 03/18/2010    Past Surgical History  Procedure Laterality Date  . Knee surgery      Current Outpatient Rx  Name  Route  Sig  Dispense  Refill  . clindamycin (CLEOCIN) 300 MG capsule   Oral   Take 1 capsule (300 mg total) by mouth 3 (three) times daily.   28 capsule   0   . oxyCODONE-acetaminophen (PERCOCET/ROXICET) 5-325 MG per tablet   Oral   Take 1 tablet by mouth every 4 (four) hours as needed for pain. Patient not taking: Reported on 08/19/2014   15 tablet   0   . promethazine (PHENERGAN) 25 MG tablet   Oral   Take 1 tablet (25 mg total) by mouth every 6 (six) hours as needed for nausea or vomiting.   12 tablet   0     Allergies Penicillins and Bee venom  Family History  Problem Relation Age of Onset  . Arthritis      FH   . Diabetes      FH    Social History History  Substance Use Topics  . Smoking status: Current Every Day Smoker -- 1.00 packs/day    Types: Cigarettes    Last Attempt to Quit: 08/07/2014  . Smokeless tobacco: Never Used  . Alcohol Use: No    Review of  Systems Constitutional: No fever, +chills Eyes: No visual changes. ENT: + sore throat. Cardiovascular: Denies chest pain. Respiratory: Denies shortness of breath. Gastrointestinal: No abdominal pain.  No nausea, no vomiting.  No diarrhea.  No constipation. Genitourinary: Negative for dysuria. Musculoskeletal: Negative for back pain. Skin: Negative for rash. Neurological: Negative for headaches, focal weakness or numbness.  10-point ROS otherwise negative.  ____________________________________________   PHYSICAL EXAM:  VITAL SIGNS: ED Triage Vitals  Enc Vitals Group     BP 03/25/15 2311 119/77 mmHg     Pulse Rate 03/25/15 2311 101     Resp 03/25/15 2311 18     Temp 03/25/15 2311 98.6 F (37 C)     Temp Source 03/25/15 2311 Oral     SpO2 03/25/15 2311 96 %     Weight 03/25/15 2311 210 lb (95.255 kg)     Height 03/25/15 2311  (1.676 m)     Head Cir --      Peak Flow --      Pain Score 03/25/15 2311 9     Pain Loc --      Pain Edu? --      Excl. in GC? --     Constitutional: Alert and oriented. Well appearing and in no acute distress. Eyes: Conjunctivae are normal. PERRL. EOMI. Head: Atraumatic. Nose: No congestion/rhinnorhea.  Mouth/Throat: Mucous membranes are moist.  Oropharynx is moderately erythematous, no exudate. No deviation of the uvula. Normal voice. Handling secretions. No lesions in his mouth. Neck: No stridor.   Cardiovascular: Normal rate, regular rhythm. Grossly normal heart sounds.  Good peripheral circulation. Respiratory: Normal respiratory effort.  No retractions. Lungs CTAB. Gastrointestinal: Soft and nontender. No distention. No abdominal bruits. No CVA tenderness. Genitourinary: deferred Musculoskeletal: No lower extremity tenderness nor edema.  No joint effusions. Neurologic:  Normal speech and language. No gross focal neurologic deficits are appreciated. No gait instability. Skin:  Skin is warm, dry and intact. No rash noted. Psychiatric:  Mood and affect are normal. Speech and behavior are normal.  ____________________________________________   LABS (all labs ordered are listed, but only abnormal results are displayed)  Labs Reviewed  POCT RAPID STREP A   ____________________________________________  EKG  none ____________________________________________  RADIOLOGY  None ____________________________________________   PROCEDURES  Procedure(s) performed: None  Critical Care performed: No  ____________________________________________   INITIAL IMPRESSION / ASSESSMENT AND PLAN / ED COURSE  Pertinent labs & imaging results that were available during my care of the patient were reviewed by me and considered in my medical decision making (see chart for details).  Rob BuntingJames A Crass is a 34 y.o. male with no chronic medical problems presents for evaluation of 2 days constant worsening sore throat/throat pain and chills. On exam, he is very well-appearing and in no acute distress. Mild tachycardic on arrival however this has resolved at this time. Exam consistent with viral pharyngitis. H&P not consistent with peritonsillar abscess. Strep negative. We'll give Toradol, Decadron, DC with return precautions, close PCP follow-up. He is comfortable with the discharge plan. ____________________________________________   FINAL CLINICAL IMPRESSION(S) / ED DIAGNOSES  Final diagnoses:  Acute viral pharyngitis      Gayla DossEryka A Simara Rhyner, MD 03/26/15 [phone]

## 2015-09-14 ENCOUNTER — Encounter: Payer: Self-pay | Admitting: Emergency Medicine

## 2015-09-14 ENCOUNTER — Emergency Department
Admission: EM | Admit: 2015-09-14 | Discharge: 2015-09-14 | Disposition: A | Discharge status: Home / Self Care | Payer: BLUE CROSS/BLUE SHIELD | Attending: Student | Admitting: Student

## 2015-09-14 ENCOUNTER — Emergency Department: Payer: BLUE CROSS/BLUE SHIELD

## 2015-09-14 DIAGNOSIS — J069 Acute upper respiratory infection, unspecified: Secondary | ICD-10-CM | POA: Diagnosis not present

## 2015-09-14 DIAGNOSIS — H6692 Otitis media, unspecified, left ear: Secondary | ICD-10-CM

## 2015-09-14 DIAGNOSIS — F17210 Nicotine dependence, cigarettes, uncomplicated: Secondary | ICD-10-CM | POA: Diagnosis not present

## 2015-09-14 DIAGNOSIS — Z792 Long term (current) use of antibiotics: Secondary | ICD-10-CM | POA: Diagnosis not present

## 2015-09-14 DIAGNOSIS — R1110 Vomiting, unspecified: Secondary | ICD-10-CM | POA: Insufficient documentation

## 2015-09-14 DIAGNOSIS — R05 Cough: Secondary | ICD-10-CM | POA: Diagnosis present

## 2015-09-14 DIAGNOSIS — Z880 Allergy status to penicillin: Secondary | ICD-10-CM | POA: Insufficient documentation

## 2015-09-14 MED ORDER — BENZONATATE 100 MG PO CAPS: 200.0000 mg | ORAL_CAPSULE | Freq: Three times a day (TID) | ORAL | Status: DC | PRN

## 2015-09-14 MED ORDER — AZITHROMYCIN 250 MG PO TABS: ORAL_TABLET | ORAL | Status: DC

## 2015-09-14 NOTE — ED Provider Notes (Signed)
[facility] Emergency Department Provider Note  ____________________________________________  Time seen: Approximately 8:17 AM  I have reviewed the triage vital signs and the nursing notes.   HISTORY  Chief Complaint Cough   HPI Matthew Baxter is a 35 y.o. male is here with history of coughing for 5 days. He states he's had some dizziness and had 3 episodes of vomiting yesterday. Cough is nonproductive he is unaware of any fever and denies chills. He states he is having some discomfort in his chest when he coughs. He states that for the entire week is not taking any medication for his cough until last night when he took over-the-counter medication once. Patient continues to smoke one pack of cigarettes per day. Currently he rates pain as an 8 out of 10 when he coughs.   History reviewed. No pertinent past medical history.  Patient Active Problem List   Diagnosis Date Noted  . TEAR MEDIAL MENISCUS 03/27/2010  . TEAR LATERAL MENISCUS 03/27/2010  . DERANGEMENT MENISCUS 03/18/2010  . SUBLUXATION PATELLAR (MALALIGNMENT) 03/18/2010  . TEAR A C L 03/18/2010    Past Surgical History  Procedure Laterality Date  . Knee surgery      Current Outpatient Rx  Name  Route  Sig  Dispense  Refill  . azithromycin (ZITHROMAX Z-PAK) 250 MG tablet      Take 2 tablets (500 mg) on  Day 1,  followed by 1 tablet (250 mg) once daily on Days 2 through 5.   6 each   0   . benzonatate (TESSALON PERLES) 100 MG capsule   Oral   Take 2 capsules (200 mg total) by mouth 3 (three) times daily as needed for cough.   30 capsule   0   . clindamycin (CLEOCIN) 300 MG capsule   Oral   Take 1 capsule (300 mg total) by mouth 3 (three) times daily.   28 capsule   0   . ibuprofen (ADVIL,MOTRIN) 600 MG tablet   Oral   Take 1 tablet (600 mg total) by mouth every 6 (six) hours as needed for moderate pain.   15 tablet   0   . oxyCODONE-acetaminophen (PERCOCET/ROXICET) 5-325 MG per  tablet   Oral   Take 1 tablet by mouth every 4 (four) hours as needed for pain. Patient not taking: Reported on 08/19/2014   15 tablet   0   . promethazine (PHENERGAN) 25 MG tablet   Oral   Take 1 tablet (25 mg total) by mouth every 6 (six) hours as needed for nausea or vomiting.   12 tablet   0     Allergies Penicillins and Bee venom  Family History  Problem Relation Age of Onset  . Arthritis      FH   . Diabetes      FH    Social History Social History  Substance Use Topics  . Smoking status: Current Every Day Smoker -- 1.00 packs/day    Types: Cigarettes    Last Attempt to Quit: 08/07/2014  . Smokeless tobacco: Never Used  . Alcohol Use: No    Review of Systems Constitutional: Unknown fever/chills ENT: No sore throat. Cardiovascular: Denies chest pain. Respiratory: Denies shortness of breath. Nonproductive cough. Gastrointestinal: No abdominal pain.  No nausea, positive vomiting with coughing.  Genitourinary: Negative for dysuria. Musculoskeletal: Negative for back pain. Skin: Negative for rash. Neurological: Negative for headaches, focal weakness or numbness.  10-point ROS otherwise negative.  ____________________________________________   PHYSICAL EXAM:  VITAL SIGNS: ED Triage Vitals  Enc Vitals Group     BP 09/14/15 0738 113/90 mmHg     Pulse Rate 09/14/15 0738 86     Resp 09/14/15 0738 20     Temp 09/14/15 0738 98.4 F (36.9 C)     Temp Source 09/14/15 0738 Oral     SpO2 09/14/15 0738 99 %     Weight 09/14/15 0738 231 lb (104.781 kg)     Height 09/14/15 0738 5\' 6"  (1.676 m)     Head Cir --      Peak Flow --      Pain Score --      Pain Loc --      Pain Edu? --      Excl. in GC? --     Constitutional: Alert and oriented. Well appearing and in no acute distress. Eyes: Conjunctivae are normal. PERRL. EOMI. Head: Atraumatic. Nose: Mild congestion/no rhinnorhea.    EACs bilaterally are clear. TMs are dull and mildly pink bilaterally at  this time. Mouth/Throat: Mucous membranes are moist.  Oropharynx non-erythematous. Neck: No stridor.   Hematological/Lymphatic/Immunilogical: No cervical lymphadenopathy. Cardiovascular: Normal rate, regular rhythm. Grossly normal heart sounds.  Good peripheral circulation. Respiratory: Normal respiratory effort.  No retractions. Lungs CTAB. Occasional non-congested cough. Gastrointestinal: Soft and nontender. No distention. Musculoskeletal: No lower extremity tenderness nor edema.  No joint effusions. Neurologic:  Normal speech and language. No gross focal neurologic deficits are appreciated. No gait instability. Skin:  Skin is warm, dry and intact. No rash noted. Psychiatric: Mood and affect are normal. Speech and behavior are normal.  ____________________________________________   LABS (all labs ordered are listed, but only abnormal results are displayed)  Labs Reviewed - No data to display  RADIOLOGY  Chest x-ray per radiologist shows no active cardiopulmonary disease. ____________________________________________   PROCEDURES  Procedure(s) performed: None  Critical Care performed: No  ____________________________________________   INITIAL IMPRESSION / ASSESSMENT AND PLAN / ED COURSE  Pertinent labs & imaging results that were available during my care of the patient were reviewed by me and considered in my medical decision making (see chart for details).  Patient was informed of his chest x-ray. There is a questionable ear infection early on and due to weather approaching will treat for otitis media. Patient was placed on a Z-Pak and Tessalon Perles. He is follow-up with [facility] if any continued problems. ____________________________________________   FINAL CLINICAL IMPRESSION(S) / ED DIAGNOSES  Final diagnoses:  Acute upper respiratory infection  Acute left otitis media, recurrence not specified, unspecified otitis media type      Tommi Rumps,  PA-C 09/14/15 1610  Gayla Doss, MD 09/14/15 1550

## 2015-09-14 NOTE — ED Notes (Signed)
Pt states cough x5 days, states some dizziness and vomiting, pt smoker, pt awake and alert in no acute distress, denies coughing up any sputum

## 2015-09-14 NOTE — ED Notes (Signed)
Developed cough on Monday   Last pm cough became worse  Has had 3 episodes of vomiting yesterday pm.. Cough is non prod  But having some discomfort in chest with cough

## 2016-09-10 ENCOUNTER — Encounter: Payer: Self-pay | Admitting: Emergency Medicine

## 2016-09-10 ENCOUNTER — Emergency Department
Admission: EM | Admit: 2016-09-10 | Discharge: 2016-09-10 | Disposition: A | Discharge status: Home / Self Care | Payer: BLUE CROSS/BLUE SHIELD | Attending: Emergency Medicine | Admitting: Emergency Medicine

## 2016-09-10 ENCOUNTER — Emergency Department: Payer: BLUE CROSS/BLUE SHIELD

## 2016-09-10 DIAGNOSIS — R1084 Generalized abdominal pain: Secondary | ICD-10-CM | POA: Diagnosis not present

## 2016-09-10 DIAGNOSIS — F17210 Nicotine dependence, cigarettes, uncomplicated: Secondary | ICD-10-CM | POA: Insufficient documentation

## 2016-09-10 DIAGNOSIS — R109 Unspecified abdominal pain: Secondary | ICD-10-CM

## 2016-09-10 LAB — URINALYSIS, COMPLETE (UACMP) WITH MICROSCOPIC
Bacteria, UA: NONE SEEN
Bilirubin Urine: NEGATIVE
GLUCOSE, UA: NEGATIVE mg/dL
Hgb urine dipstick: NEGATIVE
Ketones, ur: NEGATIVE mg/dL
Leukocytes, UA: NEGATIVE
Nitrite: NEGATIVE
PH: 7 (ref 5.0–8.0)
Protein, ur: NEGATIVE mg/dL
Specific Gravity, Urine: 1.023 (ref 1.005–1.030)
Squamous Epithelial / LPF: NONE SEEN

## 2016-09-10 LAB — COMPREHENSIVE METABOLIC PANEL
ALK PHOS: 59 U/L (ref 38–126)
ALT: 55 U/L (ref 17–63)
AST: 39 U/L (ref 15–41)
Albumin: 4.1 g/dL (ref 3.5–5.0)
Anion gap: 7 (ref 5–15)
BUN: 10 mg/dL (ref 6–20)
CO2: 26 mmol/L (ref 22–32)
CREATININE: 0.9 mg/dL (ref 0.61–1.24)
Calcium: 9.1 mg/dL (ref 8.9–10.3)
Chloride: 103 mmol/L (ref 101–111)
GFR calc Af Amer: >60 mL/min (ref >60)
GFR calc non Af Amer: >60 mL/min (ref >60)
Glucose, Bld: 111 mg/dL — ABNORMAL HIGH (ref 65–99)
Potassium: 3.7 mmol/L (ref 3.5–5.1)
SODIUM: 136 mmol/L (ref 135–145)
Total Bilirubin: 0.6 mg/dL (ref 0.3–1.2)
Total Protein: 7.6 g/dL (ref 6.5–8.1)

## 2016-09-10 LAB — CBC
HCT: 41.3 % (ref 40.0–52.0)
Hemoglobin: 14 g/dL (ref 13.0–18.0)
MCH: 31 pg (ref 26.0–34.0)
MCHC: 33.9 g/dL (ref 32.0–36.0)
MCV: 91.6 fL (ref 80.0–100.0)
Platelets: 199 10*3/uL (ref 150–440)
RBC: 4.51 MIL/uL (ref 4.40–5.90)
RDW: 12.8 % (ref 11.5–14.5)
WBC: 6.1 10*3/uL (ref 3.8–10.6)

## 2016-09-10 LAB — LIPASE, BLOOD: Lipase: 60 U/L — ABNORMAL HIGH (ref 11–51)

## 2016-09-10 MED ORDER — OXYCODONE-ACETAMINOPHEN 5-325 MG PO TABS: 1.0000 | ORAL_TABLET | Freq: Four times a day (QID) | ORAL | 0 refills | Status: AC | PRN

## 2016-09-10 MED ORDER — KETOROLAC TROMETHAMINE 30 MG/ML IJ SOLN
15.0000 mg | INTRAMUSCULAR | Status: AC
  Administered 2016-09-10: 15 mg via INTRAVENOUS
  Filled 2016-09-10: qty 1

## 2016-09-10 MED ORDER — IOPAMIDOL (ISOVUE-300) INJECTION 61%
100.0000 mL | Freq: Once | INTRAVENOUS | Status: AC | PRN
  Administered 2016-09-10: 100 mL via INTRAVENOUS

## 2016-09-10 MED ORDER — NAPROXEN 500 MG PO TABS: 500.0000 mg | ORAL_TABLET | Freq: Two times a day (BID) | ORAL | 0 refills | Status: AC

## 2016-09-10 MED ORDER — IOPAMIDOL (ISOVUE-300) INJECTION 61%
30.0000 mL | Freq: Once | INTRAVENOUS | Status: AC | PRN
  Administered 2016-09-10: 30 mL via ORAL

## 2016-09-10 NOTE — ED Provider Notes (Signed)
[facility] Emergency Department Provider Note  ____________________________________________  Time seen: Approximately 1:10 PM  I have reviewed the triage vital signs and the nursing notes.   HISTORY  Chief Complaint Abdominal Pain    HPI Matthew Baxter is a 36 y.o. male who complains of right flank pain for the past 4 days. Nonradiating. Severe, sharp and achy. Better with leaning over and sitting. He had pain like this in the past about a month ago which lasted for 2 or 3 days ago and then resolved. He was off work for a few weeks due to the holidays, and on returning back to work the symptoms reoccurred on his first day back. Patient is eating and drinking normally without any change in symptoms related to intake.  Denies fever chills vomiting diarrhea chest pain or shortness of breath. No dysuria frequency urgency or hematuria.      History reviewed. No pertinent past medical history.   Patient Active Problem List   Diagnosis Date Noted  . TEAR MEDIAL MENISCUS 03/27/2010  . TEAR LATERAL MENISCUS 03/27/2010  . DERANGEMENT MENISCUS 03/18/2010  . SUBLUXATION PATELLAR (MALALIGNMENT) 03/18/2010  . TEAR A C L 03/18/2010     Past Surgical History:  Procedure Laterality Date  . KNEE SURGERY       Prior to Admission medications   Medication Sig Start Date End Date Taking? Authorizing Provider  naproxen (NAPROSYN) 500 MG tablet Take 1 tablet (500 mg total) by mouth 2 (two) times daily with a meal. 09/10/16   Sharman Cheek, MD  oxyCODONE-acetaminophen (ROXICET) 5-325 MG tablet Take 1 tablet by mouth every 6 (six) hours as needed for severe pain. 09/10/16   Sharman Cheek, MD     Allergies Penicillins and Bee venom   Family History  Problem Relation Age of Onset  . Arthritis      FH   . Diabetes      FH    Social History Social History  Substance Use Topics  . Smoking status: Current Every Day Smoker    Packs/day: 1.00    Types:  Cigarettes    Last attempt to quit: 08/07/2014  . Smokeless tobacco: Never Used  . Alcohol use No    Review of Systems  Constitutional:   No fever or chills.  ENT:   No sore throat. No rhinorrhea. Cardiovascular:   No chest pain. Respiratory:   No dyspnea or cough. Gastrointestinal:   Right-sided abdominal pain as above without vomiting and diarrhea.  Genitourinary:   Negative for dysuria or difficulty urinating. Musculoskeletal:   Negative for focal pain or swelling Neurological:   Negative for headaches 10-point ROS otherwise negative.  ____________________________________________   PHYSICAL EXAM:  VITAL SIGNS: ED Triage Vitals  Enc Vitals Group     BP 09/10/16 0820 131/82     Pulse Rate 09/10/16 0820 79     Resp 09/10/16 0820 18     Temp 09/10/16 0820 98.3 F (36.8 C)     Temp Source 09/10/16 0820 Oral     SpO2 09/10/16 0820 100 %     Weight 09/10/16 0819 229 lb (103.9 kg)     Height 09/10/16 0819 5\' 5"  (1.651 m)     Head Circumference --      Peak Flow --      Pain Score 09/10/16 0819 7     Pain Loc --      Pain Edu? --      Excl. in GC? --  Vital signs reviewed, nursing assessments reviewed.   Constitutional:   Alert and oriented. Well appearing and in no distress. Eyes:   No scleral icterus. No conjunctival pallor. PERRL. EOMI.  No nystagmus. ENT   Head:   Normocephalic and atraumatic.   Nose:   No congestion/rhinnorhea. No septal hematoma   Mouth/Throat:   MMM, no pharyngeal erythema. No peritonsillar mass.    Neck:   No stridor. No SubQ emphysema. No meningismus. Hematological/Lymphatic/Immunilogical:   No cervical lymphadenopathy. Cardiovascular:   RRR. Symmetric bilateral radial and DP pulses.  No murmurs.  Respiratory:   Normal respiratory effort without tachypnea nor retractions. Breath sounds are clear and equal bilaterally. No wheezes/rales/rhonchi. Gastrointestinal:   Soft With exquisite tenderness to the touch along the right  lateral abdominal wall in the ilial-costal gap. There is also tenderness on palpation in the right lower quadrant.. Non distended. There is no CVA tenderness.  No rebound, rigidity, or guarding. Genitourinary:   deferred Musculoskeletal:   Nontender with normal range of motion in all extremities. No joint effusions.  No lower extremity tenderness.  No edema. Neurologic:   Normal speech and language.  CN 2-10 normal. Motor grossly intact. No gross focal neurologic deficits are appreciated.  Skin:    Skin is warm, dry and intact. No rash noted.  No petechiae, purpura, or bullae.  ____________________________________________    LABS (pertinent positives/negatives) (all labs ordered are listed, but only abnormal results are displayed) Labs Reviewed  LIPASE, BLOOD - Abnormal; Notable for the following:       Result Value   Lipase 60 (*)    All other components within normal limits  COMPREHENSIVE METABOLIC PANEL - Abnormal; Notable for the following:    Glucose, Bld 111 (*)    All other components within normal limits  URINALYSIS, COMPLETE (UACMP) WITH MICROSCOPIC - Abnormal; Notable for the following:    Color, Urine YELLOW (*)    APPearance CLEAR (*)    All other components within normal limits  CBC   ____________________________________________   EKG    ____________________________________________    RADIOLOGY  CT abdomen and pelvis unremarkable  ____________________________________________   PROCEDURES Procedures  ____________________________________________   INITIAL IMPRESSION / ASSESSMENT AND PLAN / ED COURSE  Pertinent labs & imaging results that were available during my care of the patient were reviewed by me and considered in my medical decision making (see chart for details).  Patient well appearing no acute distress. Presents with right-sided abdominal pain which is reported as severe. Patient is very tender to the touch although exam is most consistent  with a muscular abdominal wall strain of the obliques. Due to possible intra-abdominal pathology, CT scan was obtained which was unremarkable. Labs unremarkable including urinalysis creatinine and white blood cell count. Vital signs unremarkable. Pain controlled with NSAIDs in the ED. I'll discharge home with a short course of pain medicine, NSAIDs, heat therapy, follow-up with primary care. Return precautions given.     Clinical Course    ____________________________________________   FINAL CLINICAL IMPRESSION(S) / ED DIAGNOSES  Final diagnoses:  Generalized abdominal pain  Abdominal wall pain      New Prescriptions   NAPROXEN (NAPROSYN) 500 MG TABLET    Take 1 tablet (500 mg total) by mouth 2 (two) times daily with a meal.   OXYCODONE-ACETAMINOPHEN (ROXICET) 5-325 MG TABLET    Take 1 tablet by mouth every 6 (six) hours as needed for severe pain.     Portions of this note were generated with  Scientist, clinical (histocompatibility and immunogenetics). Dictation errors may occur despite best attempts at proofreading.    Sharman Cheek, MD 09/10/16 [phone]

## 2016-09-10 NOTE — ED Notes (Signed)
Patient transported to CT 

## 2016-09-10 NOTE — ED Triage Notes (Signed)
Pt c/o right side pain for 4 days. Had same pain a month ago and lasted 3 days then went away. Denies urinary sx. Has had diarrhea.

## 2016-09-15 ENCOUNTER — Emergency Department
Admission: EM | Admit: 2016-09-15 | Discharge: 2016-09-15 | Disposition: A | Discharge status: Home / Self Care | Payer: BLUE CROSS/BLUE SHIELD

## 2016-09-15 NOTE — ED Notes (Signed)
No answer when called several times from lobby 

## 2017-11-12 ENCOUNTER — Emergency Department: Payer: BLUE CROSS/BLUE SHIELD

## 2017-11-12 ENCOUNTER — Other Ambulatory Visit: Payer: Self-pay

## 2017-11-12 ENCOUNTER — Emergency Department
Admission: EM | Admit: 2017-11-12 | Discharge: 2017-11-12 | Disposition: A | Discharge status: Home / Self Care | Payer: BLUE CROSS/BLUE SHIELD | Attending: Emergency Medicine | Admitting: Emergency Medicine

## 2017-11-12 DIAGNOSIS — R0789 Other chest pain: Secondary | ICD-10-CM | POA: Insufficient documentation

## 2017-11-12 DIAGNOSIS — R079 Chest pain, unspecified: Secondary | ICD-10-CM | POA: Diagnosis present

## 2017-11-12 DIAGNOSIS — F17210 Nicotine dependence, cigarettes, uncomplicated: Secondary | ICD-10-CM | POA: Insufficient documentation

## 2017-11-12 LAB — BASIC METABOLIC PANEL
Anion gap: 8 (ref 5–15)
BUN: 14 mg/dL (ref 6–20)
CO2: 24 mmol/L (ref 22–32)
CREATININE: 0.88 mg/dL (ref 0.61–1.24)
Calcium: 8.8 mg/dL — ABNORMAL LOW (ref 8.9–10.3)
Chloride: 107 mmol/L (ref 101–111)
GFR calc Af Amer: >60 mL/min (ref >60)
GFR calc non Af Amer: >60 mL/min (ref >60)
Glucose, Bld: 131 mg/dL — ABNORMAL HIGH (ref 65–99)
Potassium: 3.9 mmol/L (ref 3.5–5.1)
Sodium: 139 mmol/L (ref 135–145)

## 2017-11-12 LAB — CBC
HCT: 41 % (ref 40.0–52.0)
Hemoglobin: 13.8 g/dL (ref 13.0–18.0)
MCH: 31.3 pg (ref 26.0–34.0)
MCHC: 33.7 g/dL (ref 32.0–36.0)
MCV: 92.8 fL (ref 80.0–100.0)
PLATELETS: 247 10*3/uL (ref 150–440)
RBC: 4.43 MIL/uL (ref 4.40–5.90)
RDW: 13.1 % (ref 11.5–14.5)
WBC: 6.5 10*3/uL (ref 3.8–10.6)

## 2017-11-12 LAB — TROPONIN I: Troponin I: <0.03 ng/mL (ref <0.03)

## 2017-11-12 MED ORDER — IBUPROFEN 800 MG PO TABS: 800.0000 mg | ORAL_TABLET | Freq: Three times a day (TID) | ORAL | 0 refills | Status: DC | PRN

## 2017-11-12 NOTE — ED Provider Notes (Signed)
[facility] Emergency Department Provider Note       Time seen: ----------------------------------------- 9:56 AM on 11/12/2017 -----------------------------------------   I have reviewed the triage vital signs and the nursing notes.  HISTORY   Chief Complaint Chest Pain    HPI Matthew Baxter is a 37 y.o. male with no significant past medical history who presents to the ED for right-sided chest pain.  Patient states he feels like something has been pulling for weeks.  Patient states he has had a stomach virus recently with some diarrhea but this has resolved.  Discomfort is 8 out of 10, nothing makes it better or worse.  History reviewed. No pertinent past medical history.  Patient Active Problem List   Diagnosis Date Noted  . TEAR MEDIAL MENISCUS 03/27/2010  . TEAR LATERAL MENISCUS 03/27/2010  . DERANGEMENT MENISCUS 03/18/2010  . SUBLUXATION PATELLAR (MALALIGNMENT) 03/18/2010  . TEAR A C L 03/18/2010    Past Surgical History:  Procedure Laterality Date  . KNEE SURGERY      Allergies Penicillins and Bee venom  Social History Social History   Tobacco Use  . Smoking status: Current Every Day Smoker    Packs/day: 1.00    Types: Cigarettes    Last attempt to quit: 08/07/2014    Years since quitting: 3.2  . Smokeless tobacco: Never Used  Substance Use Topics  . Alcohol use: No  . Drug use: No    Review of Systems Constitutional: Negative for fever. Cardiovascular: Positive for chest pain Respiratory: Negative for shortness of breath. Gastrointestinal: Negative for abdominal pain, positive for diarrhea Genitourinary: Negative for dysuria. Musculoskeletal: Negative for back pain. Skin: Negative for rash. Neurological: Negative for headaches, focal weakness or numbness.  All systems negative/normal/unremarkable except as stated in the HPI  ____________________________________________   PHYSICAL EXAM:  VITAL SIGNS: ED Triage  Vitals  Enc Vitals Group     BP 11/12/17 0943 (!) 142/84     Pulse Rate 11/12/17 0943 100     Resp 11/12/17 0943 18     Temp 11/12/17 0943 98.2 F (36.8 C)     Temp Source 11/12/17 0943 Oral     SpO2 11/12/17 0943 98 %     Weight 11/12/17 0947 190 lb (86.2 kg)     Height 11/12/17 0947 5\' 6"  (1.676 m)     Head Circumference --      Peak Flow --      Pain Score 11/12/17 0947 8     Pain Loc --      Pain Edu? --      Excl. in GC? --    Constitutional: Alert and oriented. Well appearing and in no distress. Eyes: Conjunctivae are normal. Normal extraocular movements. Cardiovascular: Normal rate, regular rhythm. No murmurs, rubs, or gallops. Respiratory: Normal respiratory effort without tachypnea nor retractions. Breath sounds are clear and equal bilaterally. No wheezes/rales/rhonchi. Gastrointestinal: Soft and nontender. Normal bowel sounds Musculoskeletal: Nontender with normal range of motion in extremities. No lower extremity tenderness nor edema.  Reproducible right chest wall and right pectoralis muscle tenderness in pain with range of motion Neurologic:  Normal speech and language. No gross focal neurologic deficits are appreciated.  Skin:  Skin is warm, dry and intact. No rash noted. Psychiatric: Mood and affect are normal. Speech and behavior are normal.  ____________________________________________  ED COURSE:  As part of my medical decision making, I reviewed the following data within the electronic MEDICAL RECORD NUMBER History obtained from family if available,  nursing notes, old chart and ekg, as well as notes from prior ED visits. Patient presented for chest pain with recent diarrhea, we will assess with labs as indicated at this time.   Procedures ____________________________________________   LABS (pertinent positives/negatives)  Labs Reviewed  BASIC METABOLIC PANEL - Abnormal; Notable for the following components:      Result Value   Glucose, Bld 131 (*)    Calcium  8.8 (*)    All other components within normal limits  CBC  TROPONIN I   ____________________________________________  DIFFERENTIAL DIAGNOSIS   Musculoskeletal pain, unstable angina, GERD, anxiety, gastroenteritis  FINAL ASSESSMENT AND PLAN  Chest pain  Plan: The patient had presented for chest pain. Patient's labs are  unremarkable. Patient's imaging is also negative.  Patient clinically has musculoskeletal right-sided chest wall pain.  He will be discharged with NSAIDs and muscle relaxants.   Ulice DashJohnathan E Kevon Tench, MD   Note: This note was generated in part or whole with voice recognition software. Voice recognition is usually quite accurate but there are transcription errors that can and very often do occur. I apologize for any typographical errors that were not detected and corrected.     Emily FilbertWilliams, Zoe Goonan E, MD 11/12/17 1032

## 2017-11-12 NOTE — ED Notes (Signed)
First Nurse Note:  Patient to Radiology and then to Room 3.  [place] RN aware.

## 2017-11-12 NOTE — ED Triage Notes (Signed)
Pt c/o intermittent right sided chest pain, "like something is pulling" for the past weeks.. Pt states he has had the stomach virus recently and is still having diarrhea. Pt is on his phone the entire time in triage on facebook..Marland Kitchen

## 2017-11-12 NOTE — ED Notes (Signed)
Pt verbalizes d/c understanding and follow up along with RX. Pt in NAD, VSS, pt ambulatory on departure. Pt unable to sign due to topaz malfnx

## 2018-04-09 ENCOUNTER — Encounter: Payer: Self-pay | Admitting: Medical Oncology

## 2018-04-09 ENCOUNTER — Emergency Department
Admission: EM | Admit: 2018-04-09 | Discharge: 2018-04-09 | Disposition: A | Discharge status: Home / Self Care | Payer: BLUE CROSS/BLUE SHIELD | Attending: Emergency Medicine | Admitting: Emergency Medicine

## 2018-04-09 DIAGNOSIS — S99921A Unspecified injury of right foot, initial encounter: Secondary | ICD-10-CM | POA: Diagnosis present

## 2018-04-09 DIAGNOSIS — Y999 Unspecified external cause status: Secondary | ICD-10-CM | POA: Diagnosis not present

## 2018-04-09 DIAGNOSIS — W2203XA Walked into furniture, initial encounter: Secondary | ICD-10-CM | POA: Diagnosis not present

## 2018-04-09 DIAGNOSIS — F17210 Nicotine dependence, cigarettes, uncomplicated: Secondary | ICD-10-CM | POA: Diagnosis not present

## 2018-04-09 DIAGNOSIS — Y939 Activity, unspecified: Secondary | ICD-10-CM | POA: Insufficient documentation

## 2018-04-09 DIAGNOSIS — Y929 Unspecified place or not applicable: Secondary | ICD-10-CM | POA: Diagnosis not present

## 2018-04-09 DIAGNOSIS — S90111A Contusion of right great toe without damage to nail, initial encounter: Secondary | ICD-10-CM | POA: Diagnosis not present

## 2018-04-09 MED ORDER — IBUPROFEN 800 MG PO TABS
800.0000 mg | ORAL_TABLET | Freq: Once | ORAL | Status: AC
  Administered 2018-04-09: 800 mg via ORAL
  Filled 2018-04-09: qty 1

## 2018-04-09 MED ORDER — IBUPROFEN 800 MG PO TABS: 800.0000 mg | ORAL_TABLET | Freq: Three times a day (TID) | ORAL | 0 refills | Status: DC | PRN

## 2018-04-09 NOTE — ED Provider Notes (Signed)
[facility] Emergency Department Provider Note   ____________________________________________   First MD Initiated Contact with Patient 04/09/18 1059     (approximate)  I have reviewed the triage vital signs and the nursing notes.   HISTORY  Chief Complaint Toe Pain    HPI Matthew Baxter is a 37 y.o. male patient presents with right great toe pain secondary to stubbing the digit last night.  Patient denies loss sensation loss of function.  Patient walks with atypical gait secondary to pain.  Patient has not noticed any deformity.  Nail is intact.  Patient rates pain as a 8/10.  Patient described pain is "aching".  Patient state use anti-inflammatory and soak the toe last night.  Patient refused x-rays.  History reviewed. No pertinent past medical history.  Patient Active Problem List   Diagnosis Date Noted  . TEAR MEDIAL MENISCUS 03/27/2010  . TEAR LATERAL MENISCUS 03/27/2010  . DERANGEMENT MENISCUS 03/18/2010  . SUBLUXATION PATELLAR (MALALIGNMENT) 03/18/2010  . TEAR A C L 03/18/2010    Past Surgical History:  Procedure Laterality Date  . KNEE SURGERY      Prior to Admission medications   Medication Sig Start Date End Date Taking? Authorizing Provider  ibuprofen (ADVIL,MOTRIN) 800 MG tablet Take 1 tablet (800 mg total) by mouth every 8 (eight) hours as needed. 11/12/17   Emily FilbertWilliams, Jonathan E, MD  ibuprofen (ADVIL,MOTRIN) 800 MG tablet Take 1 tablet (800 mg total) by mouth every 8 (eight) hours as needed for moderate pain. 04/09/18   Joni ReiningSmith, Ronald K, PA-C  naproxen (NAPROSYN) 500 MG tablet Take 1 tablet (500 mg total) by mouth 2 (two) times daily with a meal. Patient not taking: Reported on 11/12/2017 09/10/16   Sharman CheekStafford, Phillip, MD  oxyCODONE-acetaminophen (ROXICET) 5-325 MG tablet Take 1 tablet by mouth every 6 (six) hours as needed for severe pain. Patient not taking: Reported on 11/12/2017 09/10/16   Sharman CheekStafford, Phillip, MD    Allergies Penicillins and  Bee venom  Family History  Problem Relation Age of Onset  . Arthritis Unknown        FH   . Diabetes Unknown        FH    Social History Social History   Tobacco Use  . Smoking status: Current Every Day Smoker    Packs/day: 1.00    Types: Cigarettes    Last attempt to quit: 08/07/2014    Years since quitting: 3.6  . Smokeless tobacco: Never Used  Substance Use Topics  . Alcohol use: No  . Drug use: No    Review of Systems  Constitutional: No fever/chills Eyes: No visual changes. ENT: No sore throat. Cardiovascular: Denies chest pain. Respiratory: Denies shortness of breath. Gastrointestinal: No abdominal pain.  No nausea, no vomiting.  No diarrhea.  No constipation. Genitourinary: Negative for dysuria. Musculoskeletal: Right great toe pain. Skin: Negative for rash. Neurological: Negative for headaches, focal weakness or numbness. Allergic/Immunilogical: Penicillin and bee sting ____________________________________________   PHYSICAL EXAM:  VITAL SIGNS: ED Triage Vitals [04/09/18 1055]  Enc Vitals Group     BP 137/87     Pulse Rate (!) 102     Resp 18     Temp 98.9 F (37.2 C)     Temp Source Oral     SpO2 98 %     Weight 218 lb (98.9 kg)     Height 5\' 6"  (1.676 m)     Head Circumference      Peak Flow  Pain Score 8     Pain Loc      Pain Edu?      Excl. in GC?    Constitutional: Alert and oriented. Well appearing and in no acute distress. Cardiovascular: Normal rate, regular rhythm. Grossly normal heart sounds.  Good peripheral circulation. Respiratory: Normal respiratory effort.  No retractions. Lungs CTAB. Musculoskeletal: No obvious deformity or edema to the right great toe.  Patient has moderate guarding palpation lateral great toe. Neurologic:  Normal speech and language. No gross focal neurologic deficits are appreciated. No gait instability. Skin:  Skin is warm, dry and intact. No rash noted. Psychiatric: Mood and affect are normal.  Speech and behavior are normal.  ____________________________________________   LABS (all labs ordered are listed, but only abnormal results are displayed)  Labs Reviewed - No data to display ____________________________________________  EKG   ____________________________________________  RADIOLOGY Refused  Official radiology report(s): No results found.  ____________________________________________   PROCEDURES  Procedure(s) performed: None  Procedures  Critical Care performed: No  ____________________________________________   INITIAL IMPRESSION / ASSESSMENT AND PLAN / ED COURSE  As part of my medical decision making, I reviewed the following data within the electronic MEDICAL RECORD NUMBER    Right toe pain secondary to contusion.  Patient given discharge care instruction advised take anti-inflammatories as directed.      ____________________________________________   FINAL CLINICAL IMPRESSION(S) / ED DIAGNOSES  Final diagnoses:  Contusion of right great toe without damage to nail, initial encounter     ED Discharge Orders        Ordered    ibuprofen (ADVIL,MOTRIN) 800 MG tablet  Every 8 hours PRN     04/09/18 1123       Note:  This document was prepared using Dragon voice recognition software and may include unintentional dictation errors.    Joni Reining, PA-C 04/09/18 1128    Sharyn Creamer, MD 04/09/18 [phone]

## 2018-04-09 NOTE — ED Notes (Signed)
See triage note  Presents with pain to right great toe    States he got up during the night and hit his foot   Having pain to edge of nail  No deformity noted  Small amt of dried blood noted near nail bed

## 2018-04-09 NOTE — ED Triage Notes (Signed)
Pt reports last night he hit his rt great toe and injured his toe nail.

## 2019-09-11 IMAGING — CR DG CHEST 2V
2 series · 2 of 2 positions shown · non-contrast
Comparison: 09/14/2015

CLINICAL DATA: Chest pain intermittently

EXAM:
CHEST - 2 VIEW

[chest pa]
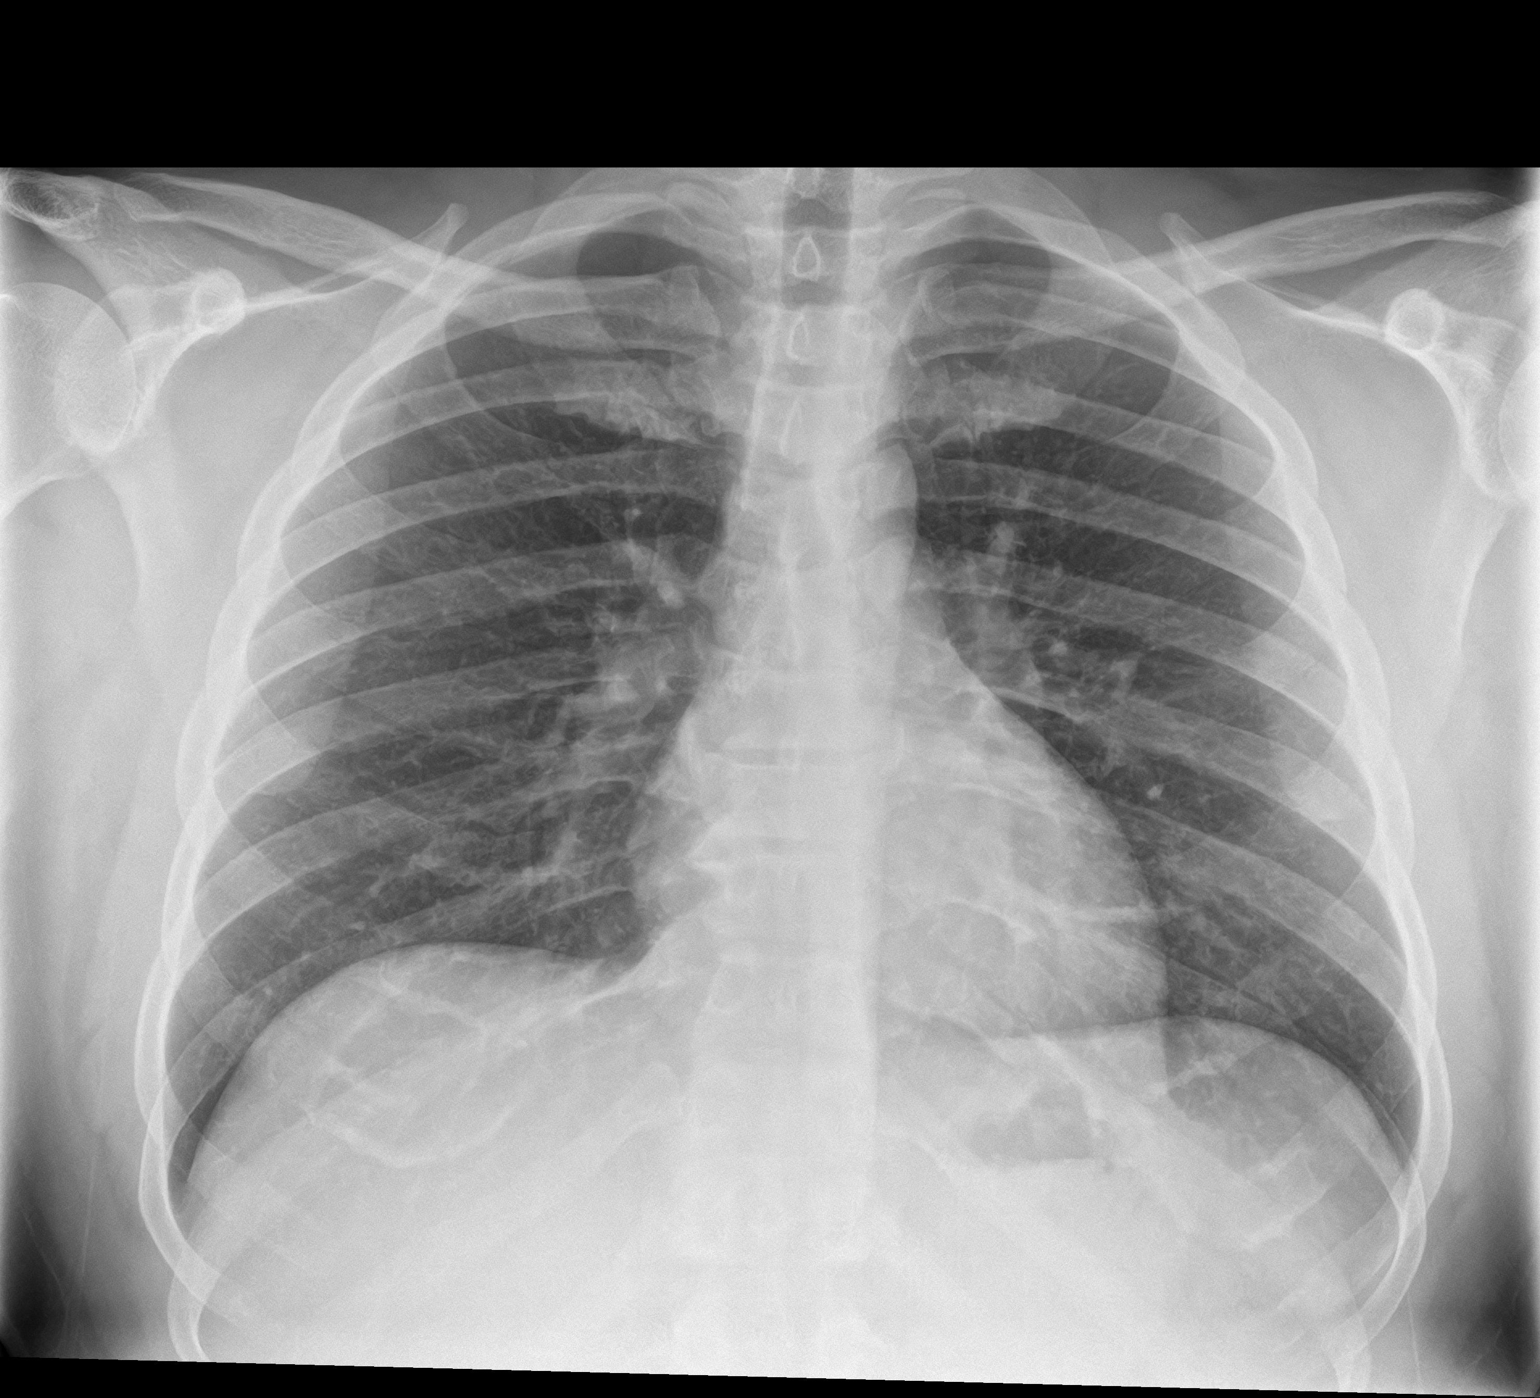

[chest lat]
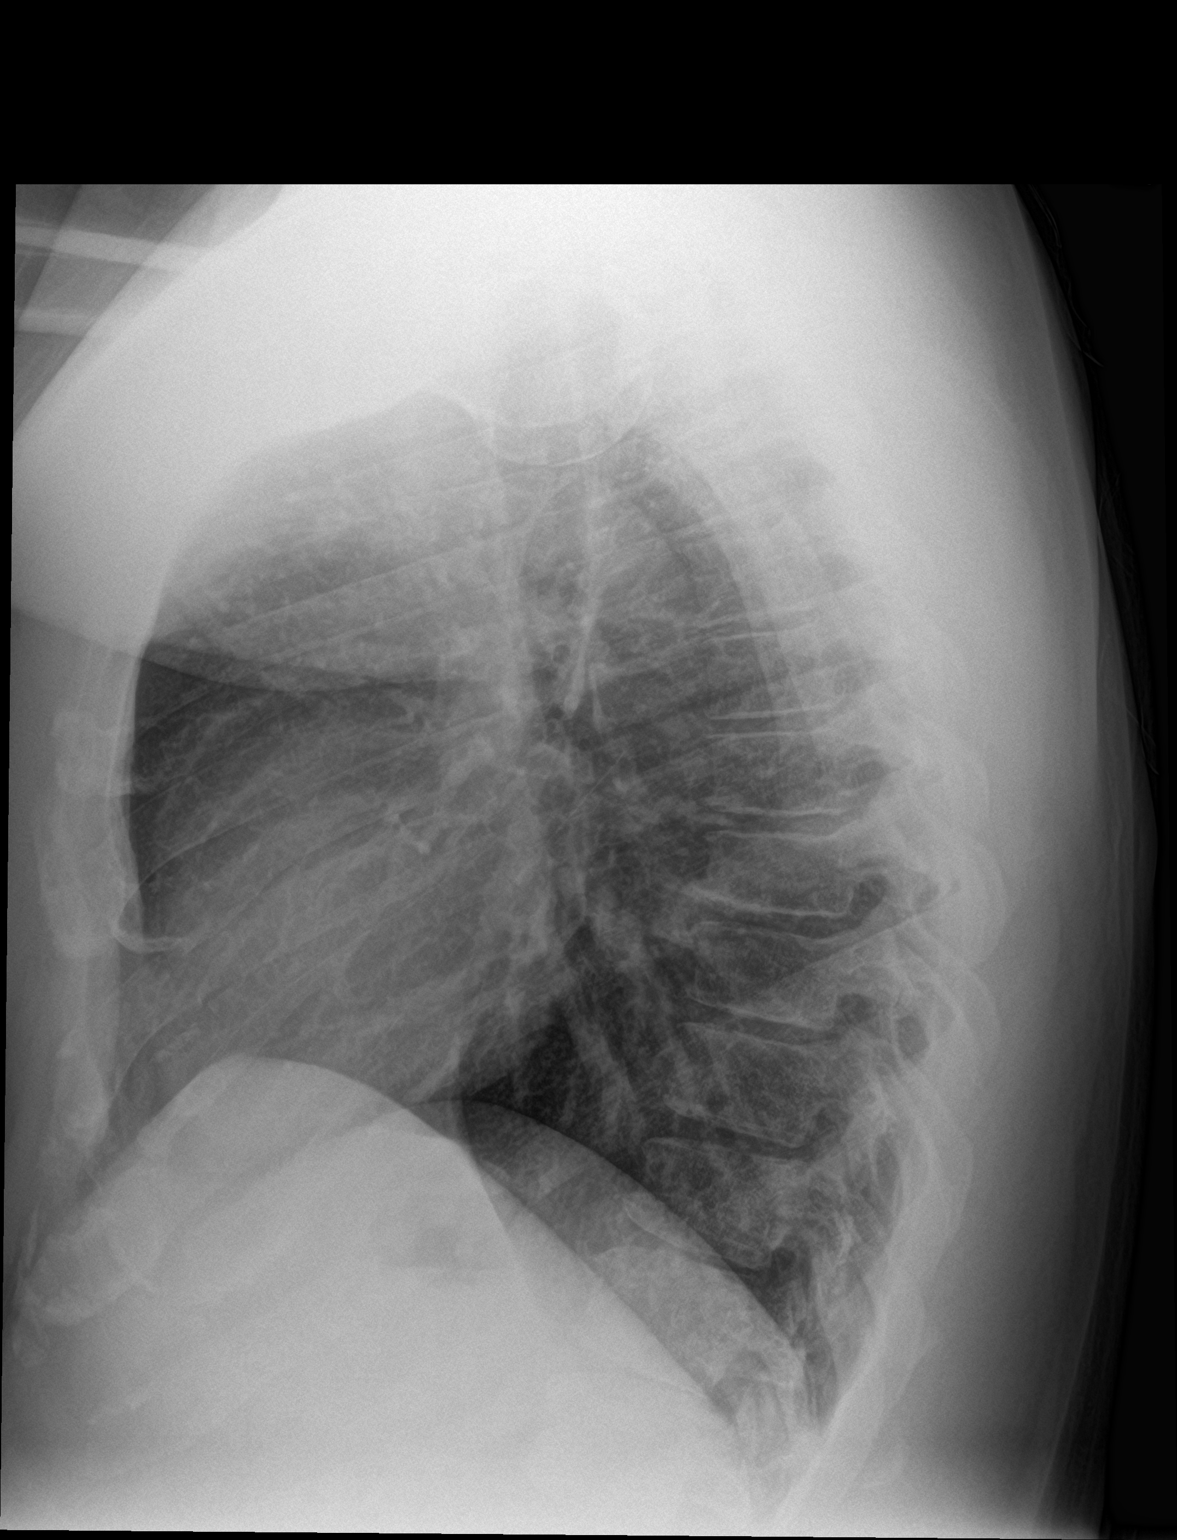

[2 of 2 positions shown; findings below may reference images not displayed]

FINDINGS: Normal heart size and mediastinal contours. There is no edema,
consolidation, effusion, or pneumothorax. Thoracic spondylosis. No
acute osseous finding.
IMPRESSION: Negative chest.

## 2019-12-02 ENCOUNTER — Emergency Department
Admission: EM | Admit: 2019-12-02 | Discharge: 2019-12-02 | Discharge status: Against Medical Advice | Payer: BLUE CROSS/BLUE SHIELD

## 2019-12-02 NOTE — ED Triage Notes (Signed)
First Nurse Note:  Arrives c/o wasp sting to right fore arm x 15 minutes.  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  No rash seen.  Voice clear and strong.  No swelling seen.  NAD

## 2021-03-13 ENCOUNTER — Emergency Department
Admission: EM | Admit: 2021-03-13 | Discharge: 2021-03-13 | Disposition: A | Discharge status: Home / Self Care | Payer: Managed Care, Other (non HMO) | Attending: Emergency Medicine | Admitting: Emergency Medicine

## 2021-03-13 ENCOUNTER — Other Ambulatory Visit: Payer: Self-pay

## 2021-03-13 DIAGNOSIS — T7840XA Allergy, unspecified, initial encounter: Secondary | ICD-10-CM

## 2021-03-13 DIAGNOSIS — F17210 Nicotine dependence, cigarettes, uncomplicated: Secondary | ICD-10-CM | POA: Diagnosis not present

## 2021-03-13 DIAGNOSIS — T782XXA Anaphylactic shock, unspecified, initial encounter: Secondary | ICD-10-CM | POA: Diagnosis not present

## 2021-03-13 DIAGNOSIS — W57XXXA Bitten or stung by nonvenomous insect and other nonvenomous arthropods, initial encounter: Secondary | ICD-10-CM | POA: Diagnosis not present

## 2021-03-13 MED ORDER — METHYLPREDNISOLONE SODIUM SUCC 125 MG IJ SOLR
INTRAMUSCULAR | Status: AC
  Administered 2021-03-13: 19:00:00 125 mg
  Filled 2021-03-13: qty 2

## 2021-03-13 MED ORDER — EPINEPHRINE 0.3 MG/0.3ML IJ SOAJ
0.3000 mg | Freq: Once | INTRAMUSCULAR | Status: AC
Start: 2021-03-13 — End: 2021-03-13

## 2021-03-13 MED ORDER — EPINEPHRINE 0.3 MG/0.3ML IJ SOAJ
INTRAMUSCULAR | Status: AC
  Administered 2021-03-13: 19:00:00 0.3 mg via INTRAMUSCULAR
  Filled 2021-03-13: qty 0.3

## 2021-03-13 MED ORDER — SODIUM CHLORIDE 0.9 % IV BOLUS
1000.0000 mL | Freq: Once | INTRAVENOUS | Status: AC
  Administered 2021-03-13: 19:00:00 1000 mL via INTRAVENOUS

## 2021-03-13 MED ORDER — EPINEPHRINE 0.3 MG/0.3ML IJ SOAJ: 0.3000 mg | INTRAMUSCULAR | 1 refills | Status: DC | PRN

## 2021-03-13 MED ORDER — DIPHENHYDRAMINE HCL 50 MG/ML IJ SOLN
INTRAMUSCULAR | Status: AC
  Administered 2021-03-13: 19:00:00 50 mg
  Filled 2021-03-13: qty 1

## 2021-03-13 MED ORDER — FAMOTIDINE IN NACL 20-0.9 MG/50ML-% IV SOLN
20.0000 mg | Freq: Once | INTRAVENOUS | Status: AC
  Administered 2021-03-13: 19:00:00 20 mg via INTRAVENOUS

## 2021-03-13 NOTE — ED Provider Notes (Signed)
[facility] Emergency Department Provider Note   ____________________________________________   Event Date/Time   First MD Initiated Contact with Patient 03/13/21 1841     (approximate)  I have reviewed the triage vital signs and the nursing notes.   HISTORY  Chief Complaint Allergic Reaction    HPI Matthew Baxter is a 40 y.o. male with no significant past medical history who presents to the ED for allergic reaction.  Patient reports that just prior to arrival he was stung by multiple wasps that were in a nest under his porch.  He reports being stung in his right arm, left arm, and on his chest.  He now feels like there is a "lump" in the back of his throat and he has also noticed a rash over his left chest.  He denies any lightheadedness, nausea, vomiting, or diarrhea.  He has been stung by bees once before with similar reaction, but was not sure if he required EpiPen at that time.  He has not taken anything for his symptoms prior to arrival.        History reviewed. No pertinent past medical history.  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 Authorizing Provider  EPINEPHrine 0.3 mg/0.3 mL IJ SOAJ injection Inject 0.3 mg into the muscle as needed for anaphylaxis. 03/13/21  Yes Chesley Noon, MD  ibuprofen (ADVIL,MOTRIN) 800 MG tablet Take 1 tablet (800 mg total) by mouth every 8 (eight) hours as needed. 11/12/17   Emily Filbert, MD  ibuprofen (ADVIL,MOTRIN) 800 MG tablet Take 1 tablet (800 mg total) by mouth every 8 (eight) hours as needed for moderate pain. 04/09/18   Joni Reining, PA-C  naproxen (NAPROSYN)  500 MG tablet Take 1 tablet (500 mg total) by mouth 2 (two) times daily with a meal. Patient not taking: Reported on 11/12/2017 09/10/16   Sharman Cheek, MD  oxyCODONE-acetaminophen (ROXICET) 5-325 MG tablet Take 1 tablet by mouth every 6 (six) hours as needed for severe pain. Patient not taking: Reported on 11/12/2017 09/10/16   Sharman Cheek, MD    Allergies Penicillins and Bee venom  Family History  Problem Relation Age of Onset   Arthritis Unknown        FH    Diabetes Unknown        FH    Social History Social History   Tobacco Use   Smoking status: Every Day    Packs/day: 1.00    Pack years: 0.00    Types: Cigarettes    Last attempt to quit: 08/07/2014    Years since quitting: 6.6   Smokeless tobacco: Never  Substance Use Topics   Alcohol use: No   Drug use: No    Review of Systems  Constitutional: No fever/chills Eyes: No visual changes. ENT: Positive for sore throat. Cardiovascular: Denies chest pain. Respiratory: Denies shortness of breath. Gastrointestinal: No abdominal pain.  No nausea, no vomiting.  No diarrhea.  No constipation. Genitourinary: Negative for dysuria. Musculoskeletal: Negative for back pain. Skin: Positive for rash. Neurological: Negative for headaches, focal weakness or numbness.  ____________________________________________   PHYSICAL EXAM:  VITAL  SIGNS: ED Triage Vitals  Enc Vitals Group     BP 03/13/21 1834 (!) 119/104     Pulse Rate 03/13/21 1834 92     Resp 03/13/21 1834 15     Temp 03/13/21 1834 99.2 F (37.3 C)     Temp src --      SpO2 03/13/21 1834 98 %     Weight 03/13/21 1835 212 lb (96.2 kg)     Height 03/13/21 1835 5\' 6"  (1.676 m)     Head Circumference --      Peak Flow --      Pain Score 03/13/21 1834 8     Pain Loc --      Pain Edu? --      Excl. in GC? --     Constitutional: Alert and oriented. Eyes: Conjunctivae are normal. Head: Atraumatic. Nose: No congestion/rhinnorhea. Mouth/Throat: Mucous  membranes are moist.  No oropharyngeal or uvular edema. Neck: Normal ROM Cardiovascular: Normal rate, regular rhythm. Grossly normal heart sounds.  2+ radial pulses bilaterally. Respiratory: Normal respiratory effort.  No retractions. Lungs CTAB. Gastrointestinal: Soft and nontender. No distention. Genitourinary: deferred Musculoskeletal: No lower extremity tenderness nor edema. Neurologic:  Normal speech and language. No gross focal neurologic deficits are appreciated. Skin:  Skin is warm, dry and intact.  Raised red rash noted over left chest. Psychiatric: Mood and affect are normal. Speech and behavior are normal.  ____________________________________________   LABS (all labs ordered are listed, but only abnormal results are displayed)  Labs Reviewed - No data to display ____________________________________________  EKG  ED ECG REPORT I, 05/14/21, the attending physician, personally viewed and interpreted this ECG.   Date: 03/13/2021  EKG Time: 18:34  Rate: 95  Rhythm: normal sinus rhythm  Axis: Normal  Intervals:none  ST&T Change: None   PROCEDURES  Procedure(s) performed (including Critical Care):  Procedures   ____________________________________________   INITIAL IMPRESSION / ASSESSMENT AND PLAN / ED COURSE      40 year old male with no significant past medical history presents to the ED after being stung by multiple wasps just prior to arrival, has since noticed rash to his chest as well as "lump" in his throat.  He is not in any respiratory distress and is maintaining O2 sats on room air, there is no apparent edema in his oropharynx, but given multisystem involvement we will treat with EpiPen.  We will also give IV Solu-Medrol, Benadryl, and Pepcid.  EKG shows no evidence of arrhythmia or ischemia.  We will observe for 3 to 4 hours to ensure his symptoms are improving.  Patient with no recurrent anaphylactic symptoms and he is appropriate for discharge  home with PCP follow-up.  Will prescribe EpiPen for use as needed and he was counseled to return to the ED if he requires EpiPen in the future.  Patient agrees with plan.      ____________________________________________   FINAL CLINICAL IMPRESSION(S) / ED DIAGNOSES  Final diagnoses:  Allergic reaction, initial encounter  Anaphylaxis, initial encounter     ED Discharge Orders          Ordered    EPINEPHrine 0.3 mg/0.3 mL IJ SOAJ injection  As needed        03/13/21 2240             Note:  This document was prepared using Dragon voice recognition software and may include unintentional dictation errors.    05/14/21, MD 03/13/21 2241

## 2021-03-13 NOTE — ED Triage Notes (Signed)
Pt POV c/o multiple "wasp" bites that happened 15 mins prior to arrival. Pt reports feeling lump in throat - pt assessed and airway clear. Pt alert and speech is not slurred.

## 2022-04-29 ENCOUNTER — Other Ambulatory Visit: Payer: Self-pay

## 2022-04-29 DIAGNOSIS — I82611 Acute embolism and thrombosis of superficial veins of right upper extremity: Secondary | ICD-10-CM | POA: Diagnosis not present

## 2022-04-29 DIAGNOSIS — T8172XA Complication of vein following a procedure, not elsewhere classified, initial encounter: Secondary | ICD-10-CM | POA: Insufficient documentation

## 2022-04-29 DIAGNOSIS — M7989 Other specified soft tissue disorders: Secondary | ICD-10-CM | POA: Diagnosis present

## 2022-04-29 LAB — CBC WITH DIFFERENTIAL/PLATELET
Abs Immature Granulocytes: 0.04 10*3/uL (ref 0.00–0.07)
Basophils Absolute: 0 10*3/uL (ref 0.0–0.1)
Basophils Relative: 1 %
Eosinophils Absolute: 0.1 10*3/uL (ref 0.0–0.5)
Eosinophils Relative: 1 %
HCT: 46.8 % (ref 39.0–52.0)
Hemoglobin: 15.4 g/dL (ref 13.0–17.0)
Immature Granulocytes: 1 %
Lymphocytes Relative: 23 %
Lymphs Abs: 1.9 10*3/uL (ref 0.7–4.0)
MCH: 31.5 pg (ref 26.0–34.0)
MCHC: 32.9 g/dL (ref 30.0–36.0)
MCV: 95.7 fL (ref 80.0–100.0)
Monocytes Absolute: 0.7 10*3/uL (ref 0.1–1.0)
Monocytes Relative: 9 %
Neutro Abs: 5.6 10*3/uL (ref 1.7–7.7)
Neutrophils Relative %: 65 %
Platelets: 233 10*3/uL (ref 150–400)
RBC: 4.89 MIL/uL (ref 4.22–5.81)
RDW: 13 % (ref 11.5–15.5)
WBC: 8.4 10*3/uL (ref 4.0–10.5)
nRBC: 0 % (ref 0.0–0.2)

## 2022-04-29 NOTE — ED Triage Notes (Signed)
Pt ambulatory to triage with edema and redness to right Antecubital area. Area is warm to touch, and pt reports he feels tingling in that area Pt gave plasma Friday in same area, and pain began the day after.

## 2022-04-30 ENCOUNTER — Emergency Department
Admission: EM | Admit: 2022-04-30 | Discharge: 2022-04-30 | Disposition: A | Discharge status: Home / Self Care | Payer: Managed Care, Other (non HMO) | Attending: Emergency Medicine | Admitting: Emergency Medicine

## 2022-04-30 ENCOUNTER — Emergency Department: Payer: Managed Care, Other (non HMO)

## 2022-04-30 DIAGNOSIS — I82611 Acute embolism and thrombosis of superficial veins of right upper extremity: Secondary | ICD-10-CM

## 2022-04-30 DIAGNOSIS — T8172XA Complication of vein following a procedure, not elsewhere classified, initial encounter: Secondary | ICD-10-CM

## 2022-04-30 LAB — PROTIME-INR
INR: 0.9 (ref 0.8–1.2)
Prothrombin Time: 12.3 seconds (ref 11.4–15.2)

## 2022-04-30 LAB — COMPREHENSIVE METABOLIC PANEL
ALT: 19 U/L (ref 0–44)
AST: 18 U/L (ref 15–41)
Albumin: 3.6 g/dL (ref 3.5–5.0)
Alkaline Phosphatase: 50 U/L (ref 38–126)
Anion gap: 6 (ref 5–15)
BUN: 9 mg/dL (ref 6–20)
CO2: 26 mmol/L (ref 22–32)
Calcium: 8.8 mg/dL — ABNORMAL LOW (ref 8.9–10.3)
Chloride: 109 mmol/L (ref 98–111)
Creatinine, Ser: 0.98 mg/dL (ref 0.61–1.24)
GFR, Estimated: >60 mL/min (ref >60)
Glucose, Bld: 120 mg/dL — ABNORMAL HIGH (ref 70–99)
Potassium: 3.6 mmol/L (ref 3.5–5.1)
Sodium: 141 mmol/L (ref 135–145)
Total Bilirubin: 0.8 mg/dL (ref 0.3–1.2)
Total Protein: 6.7 g/dL (ref 6.5–8.1)

## 2022-04-30 NOTE — ED Notes (Signed)
E-signature pad unavailable - Pt verbalized understanding of D/C information - no additional concerns at this time.  

## 2022-04-30 NOTE — ED Provider Notes (Signed)
[facility] Provider Note    Event Date/Time   First MD Initiated Contact with Patient 04/30/22 [phone]     (approximate)   History   Extremity Pain   HPI  Matthew Baxter is a 41 y.o. male who presents for evaluation of pain and some swelling in the right antecubital fossa after donating plasma about 4 days ago.  He says that he has done this before and never had any trouble.  The plasma donation was not particularly painful, just stung a little bit.  However, the following day he started to have some pain at the site of the IV, and then he started to develop some swelling and a little bit of redness further down on his forearm.  The patient adamantly denies any IV drug use.  He donates plasma regularly and recently switched to his right arm from his left.  He has not been passing out or feeling lightheaded or dizzy.  No fever.  No chest pain or shortness of breath.  No history of blood clots in the legs of the lungs.     Physical Exam   Triage Vital Signs: ED Triage Vitals [04/29/22 2333]  Enc Vitals Group     BP (!) 154/61     Pulse Rate 74     Resp 18     Temp 98 F (36.7 C)     Temp Source Oral     SpO2 99 %     Weight 96.2 kg (212 lb)     Height 1.651 m (5\' 5" )     Head Circumference      Peak Flow      Pain Score 10     Pain Loc      Pain Edu?      Excl. in GC?     Most recent vital signs: Vitals:   04/30/22 0247 04/30/22 0345  BP: (!) 140/90 (!) 150/87  Pulse: 71 71  Resp:  18  Temp:  98.2 F (36.8 C)  SpO2: 98% 98%     General: Awake, no distress.  CV:  Good peripheral perfusion.  Easily palpable right radial pulse.  Normal capillary refill. Resp:  Normal effort.  No wheezing.  No accessory muscle usage. Abd:  No distention.  Other:  Patient has a palpable indurated area at the site of the antecubital IV insertion point from the plasma donation.  It is tender to the touch.  There is area of mild erythema distally on the forearm,  but it is confined to the proximal forearm and does not extend up to the upper arm.   ED Results / Procedures / Treatments   Labs (all labs ordered are listed, but only abnormal results are displayed) Labs Reviewed  COMPREHENSIVE METABOLIC PANEL - Abnormal; Notable for the following components:      Result Value   Glucose, Bld 120 (*)    Calcium 8.8 (*)    All other components within normal limits  CBC WITH DIFFERENTIAL/PLATELET  PROTIME-INR      RADIOLOGY I viewed and interpreted the patient's right arm soft tissue ultrasound and venous Doppler ultrasound.  I could not appreciate a DVT nor an obvious abscess or fluid collection.  The radiologist confirmed superficial vein thrombosis (distal cephalic vein) without any obvious abscess or infectious process.    PROCEDURES:  Critical Care performed: No  Procedures   MEDICATIONS ORDERED IN ED: Medications - No data to display   IMPRESSION / MDM /  ASSESSMENT AND PLAN / ED COURSE  I reviewed the triage vital signs and the nursing notes.                              Differential diagnosis includes, but is not limited to, cellulitis, abscess, bacteremia, DVT, superficial vein thrombosis, thrombophlebitis.  Patient's presentation is most consistent with acute presentation with potential threat to life or bodily function.  Labs/studies ordered: CMP, pro time-INR, CBC with differential, right upper extremity soft tissue ultrasound, right upper extremity ultrasound venous Doppler.  Labs are all within normal limits.  As documented above, ultrasounds do not show any sign of infection including abscess, but confirm a superficial vein thrombosis in the distal cephalic vein which is consistent with the clinical exam findings.  I had my usual and customary thrombophlebitis/superficial vein thrombosis discussion with the patient.  I explained that he does not need anticoagulation but needs to use warm compresses and NSAIDs.  I gave him  follow-up information with vascular surgery clinic because he does not currently have a PCP, and I also gave him information about how to establish a PCP at [facility].  He said that he understands and agrees with the plan.  I gave him my usual and customary return precautions.       FINAL CLINICAL IMPRESSION(S) / ED DIAGNOSES   Final diagnoses:  Acute thrombosis of right cephalic vein  Thrombophlebitis following injection or infusion, initial encounter     Rx / DC Orders   ED Discharge Orders     None        Note:  This document was prepared using Dragon voice recognition software and may include unintentional dictation errors.   Loleta Rose, MD 04/30/22 [phone]

## 2022-04-30 NOTE — Discharge Instructions (Addendum)
Please use warm compresses on the affected area of your right arm and try to keep it elevated when possible.  Use over-the-counter ibuprofen or naproxen according to the label instructions to help with the pain and inflammation.  Your symptoms should improve within about a week.  You can call the the office of Dr. Wyn Quaker to see him or one of his colleagues in the vascular surgery clinic if you continue having problems in about a week.  You should also call [facility] and asked to establish a primary care provider.    Return to the emergency department if you develop new or worsening symptoms that concern you.

## 2023-01-19 ENCOUNTER — Other Ambulatory Visit: Payer: Self-pay

## 2023-01-19 ENCOUNTER — Emergency Department
Admission: EM | Admit: 2023-01-19 | Discharge: 2023-01-19 | Disposition: A | Discharge status: Home / Self Care | Payer: 59 | Attending: Emergency Medicine | Admitting: Emergency Medicine

## 2023-01-19 DIAGNOSIS — S90861A Insect bite (nonvenomous), right foot, initial encounter: Secondary | ICD-10-CM | POA: Diagnosis not present

## 2023-01-19 DIAGNOSIS — Z1152 Encounter for screening for COVID-19: Secondary | ICD-10-CM | POA: Insufficient documentation

## 2023-01-19 DIAGNOSIS — W57XXXA Bitten or stung by nonvenomous insect and other nonvenomous arthropods, initial encounter: Secondary | ICD-10-CM | POA: Insufficient documentation

## 2023-01-19 DIAGNOSIS — J029 Acute pharyngitis, unspecified: Secondary | ICD-10-CM | POA: Insufficient documentation

## 2023-01-19 DIAGNOSIS — R070 Pain in throat: Secondary | ICD-10-CM | POA: Diagnosis present

## 2023-01-19 LAB — SARS CORONAVIRUS 2 BY RT PCR: SARS Coronavirus 2 by RT PCR: NEGATIVE

## 2023-01-19 LAB — GROUP A STREP BY PCR: Group A Strep by PCR: NOT DETECTED

## 2023-01-19 MED ORDER — EPINEPHRINE 0.3 MG/0.3ML IJ SOAJ: 0.3000 mg | INTRAMUSCULAR | 1 refills | Status: AC | PRN

## 2023-01-19 MED ORDER — IBUPROFEN 600 MG PO TABS: 600.0000 mg | ORAL_TABLET | Freq: Four times a day (QID) | ORAL | 0 refills | Status: AC | PRN

## 2023-01-19 MED ORDER — DEXAMETHASONE 10 MG/ML FOR PEDIATRIC ORAL USE
10.0000 mg | Freq: Once | INTRAMUSCULAR | Status: AC
  Administered 2023-01-19: 10 mg via ORAL
  Filled 2023-01-19: qty 1

## 2023-01-19 MED ORDER — ACETAMINOPHEN 500 MG PO TABS
1000.0000 mg | ORAL_TABLET | Freq: Once | ORAL | Status: AC
  Administered 2023-01-19: 1000 mg via ORAL
  Filled 2023-01-19: qty 2

## 2023-01-19 MED ORDER — KETOROLAC TROMETHAMINE 30 MG/ML IJ SOLN
30.0000 mg | Freq: Once | INTRAMUSCULAR | Status: AC
  Administered 2023-01-19: 30 mg via INTRAMUSCULAR
  Filled 2023-01-19: qty 1

## 2023-01-19 NOTE — ED Notes (Signed)
Needs temp, thermometer in triage not working.

## 2023-01-19 NOTE — ED Provider Notes (Signed)
[facility] Provider Note    Event Date/Time   First MD Initiated Contact with Patient 01/19/23 2052     (approximate)   History   throat pain   HPI  Matthew Baxter is a 42 y.o. male who is otherwise healthy who comes in with concerns for right throat pain.  Patient reports that yesterday he was cleaning his car out when he was stung by something on his right foot.  He had a little bit of area of redness.  He denied any throat issues until this morning and the area of redness on the foot has since all resolved.  He reports having some pain in his right jaw.  He reports having some fevers, chills, body aches.  He denies any significant shortness of breath or coughing.  Denies any current abdominal pain.  He has not taken anything to try to help with the symptoms.  Physical Exam   Triage Vital Signs: ED Triage Vitals  Enc Vitals Group     BP 01/19/23 1747 (!) 138/94     Pulse Rate 01/19/23 1747 (!) 106     Resp 01/19/23 1747 20     Temp --      Temp src --      SpO2 01/19/23 1747 96 %     Weight 01/19/23 1744 193 lb (87.5 kg)     Height 01/19/23 1744 5\' 5"  (1.651 m)     Head Circumference --      Peak Flow --      Pain Score 01/19/23 1743 8     Pain Loc --      Pain Edu? --      Excl. in GC? --     Most recent vital signs: Vitals:   01/19/23 1747  BP: (!) 138/94  Pulse: (!) 106  Resp: 20  SpO2: 96%     General: Awake, no distress.  CV:  Good peripheral perfusion.  Resp:  Normal effort.  Abd:  No distention.  Other:  Uvula is midline.  Tonsils slightly red but no exudates noted no significant swelling noted.  No significant swelling noted on his face.  He is a little bit of tenderness over a lymph node on his right side but not significantly swollen.  He is tolerating secretions speaking in full sentences.  He has a few areas of missing teeth but there is no abscess palpated or current tooth pain. No swelling redness of the foot.    ED  Results / Procedures / Treatments   Labs (all labs ordered are listed, but only abnormal results are displayed) Labs Reviewed  GROUP A STREP BY PCR  SARS CORONAVIRUS 2 BY RT PCR     PROCEDURES:  Critical Care performed: No  Procedures   MEDICATIONS ORDERED IN ED: Medications  ketorolac (TORADOL) 30 MG/ML injection 30 mg (has no administration in time range)  dexamethasone (DECADRON) 10 MG/ML injection for Pediatric ORAL use 10 mg (has no administration in time range)  acetaminophen (TYLENOL) tablet 1,000 mg (has no administration in time range)     IMPRESSION / MDM / ASSESSMENT AND PLAN / ED COURSE  I reviewed the triage vital signs and the nursing notes.   Patient's presentation is most consistent with acute presentation with potential threat to life or bodily function.   Patient comes in with right throat pain.  No obvious abscess noted on examination no evidence of peritonsillar abscess or retropharyngeal abscess patient well-appearing suspect more  likely viral in nature versus allergic.  Do not believe this represents anaphylaxis.  He is got no current swelling of his foot and the stone was quite a while ago but we did discuss he should have an EpiPen at home for case for future reactions and he expressed understanding and he would like a new prescription for 1.  We will trial some Decadron, Toradol, Tylenol to help with symptoms.  We discussed symptomatic treatment at home.  He expressed understanding and felt comfortable with this plan.  We discussed antibiotics but this time we have opted to hold off given reassuring examination but we discussed return precautions in regards to this.      FINAL CLINICAL IMPRESSION(S) / ED DIAGNOSES   Final diagnoses:  Viral pharyngitis  Insect bite of right foot, initial encounter     Rx / DC Orders   ED Discharge Orders          Ordered    EPINEPHrine 0.3 mg/0.3 mL IJ SOAJ injection  As needed        01/19/23 2123     ibuprofen (ADVIL) 600 MG tablet  Every 6 hours PRN        01/19/23 2123             Note:  This document was prepared using Dragon voice recognition software and may include unintentional dictation errors.   Concha Se, MD 01/19/23 2124

## 2023-01-19 NOTE — ED Provider Triage Note (Signed)
Emergency Medicine Provider Triage Evaluation Note  Matthew Baxter, a 42 y.o. male  was evaluated in triage.  Pt complains of sore throat and malaise. He notes onset after a bite/sting from and unseen insect on the left foot last night. 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 Patient has diphenhydramine prior to arrival.   Review of Systems  Positive: Sore throat, fatigue, anorexia Negative: FCS, NVD  Physical Exam  BP (!) 138/94   Pulse (!) 106   Resp 20   Ht 5\' 5"  (1.651 m)   Wt 87.5 kg   SpO2 96%   BMI 32.12 kg/m  Gen:   Awake, no distress  NAD Resp:  Normal effort CTA MSK:   Moves extremities without difficulty No skin changes to dorsal right foot.  Other:    Medical Decision Making  Medically screening exam initiated at 6:05 PM.  Appropriate orders placed.  Matthew Baxter was informed that the remainder of the evaluation will be completed by another provider, this initial triage assessment does not replace that evaluation, and the importance of remaining in the ED until their evaluation is complete.  Patient to the ED for evaluation of neck pain and sore throat following and insect bite to the foot last night.    Matthew Hoard, PA-C 01/19/23 1814

## 2023-01-19 NOTE — Discharge Instructions (Addendum)
Take Tylenol 1 g every 8 hours with the ibuprofen with food.  He can get Allegra OR Zyrtec over-the-counter to take once daily.  Take Flonase for your nose.  Use Benadryl 25 to 50 mg at nighttime.  Use the EpiPen in case of a future reaction.  Your COVID test is still pending.  Follow this up in MyChart.  Return to the ER for worsening symptoms, fevers,  or any other concerns

## 2023-01-19 NOTE — ED Triage Notes (Signed)
Pt to ED with partner POV for R throat pain since this AM. Pt states neck is swollen. Pt was bitten by something yesterday on foot. Is allergic to bees, penicillins. Voice clear, this RN unable to see or feel swelling to neck or inside throat. Also body aches since this AM and unsure if sore throat.

## 2023-05-05 ENCOUNTER — Other Ambulatory Visit: Payer: Self-pay

## 2023-05-05 DIAGNOSIS — Z4800 Encounter for change or removal of nonsurgical wound dressing: Secondary | ICD-10-CM | POA: Insufficient documentation

## 2023-05-05 DIAGNOSIS — B353 Tinea pedis: Secondary | ICD-10-CM | POA: Diagnosis not present

## 2023-05-05 NOTE — ED Triage Notes (Addendum)
Pt reports wound in between L pinky toe and 4th digit x 10 years. Pt reports concern for being diabetic due to family history. Pt reports pulled the skin off the wound earlier this week and now has been having pus from it. Pt ambulatory to triage. Alert and oriented following commands. Breathing unlabored speaking in full sentences. Pt hyperactive, animated, and boisterous in triage.

## 2023-05-06 ENCOUNTER — Emergency Department
Admission: EM | Admit: 2023-05-06 | Discharge: 2023-05-06 | Disposition: A | Discharge status: Home / Self Care | Payer: 59 | Attending: Emergency Medicine | Admitting: Emergency Medicine

## 2023-05-06 ENCOUNTER — Emergency Department: Payer: 59

## 2023-05-06 DIAGNOSIS — B353 Tinea pedis: Secondary | ICD-10-CM

## 2023-05-06 LAB — CBC WITH DIFFERENTIAL/PLATELET
Abs Immature Granulocytes: 0.03 10*3/uL (ref 0.00–0.07)
Basophils Absolute: 0 10*3/uL (ref 0.0–0.1)
Basophils Relative: 1 %
Eosinophils Absolute: 0.1 10*3/uL (ref 0.0–0.5)
Eosinophils Relative: 2 %
HCT: 43.5 % (ref 39.0–52.0)
Hemoglobin: 14.3 g/dL (ref 13.0–17.0)
Immature Granulocytes: 0 %
Lymphocytes Relative: 29 %
Lymphs Abs: 2.3 10*3/uL (ref 0.7–4.0)
MCH: 31.5 pg (ref 26.0–34.0)
MCHC: 32.9 g/dL (ref 30.0–36.0)
MCV: 95.8 fL (ref 80.0–100.0)
Monocytes Absolute: 0.7 10*3/uL (ref 0.1–1.0)
Monocytes Relative: 9 %
Neutro Abs: 4.7 10*3/uL (ref 1.7–7.7)
Neutrophils Relative %: 59 %
Platelets: 286 10*3/uL (ref 150–400)
RBC: 4.54 MIL/uL (ref 4.22–5.81)
RDW: 12.3 % (ref 11.5–15.5)
WBC: 7.8 10*3/uL (ref 4.0–10.5)
nRBC: 0 % (ref 0.0–0.2)

## 2023-05-06 LAB — COMPREHENSIVE METABOLIC PANEL
ALT: 23 U/L (ref 0–44)
AST: 20 U/L (ref 15–41)
Albumin: 3.6 g/dL (ref 3.5–5.0)
Alkaline Phosphatase: 59 U/L (ref 38–126)
Anion gap: 8 (ref 5–15)
BUN: 13 mg/dL (ref 6–20)
CO2: 25 mmol/L (ref 22–32)
Calcium: 8.8 mg/dL — ABNORMAL LOW (ref 8.9–10.3)
Chloride: 105 mmol/L (ref 98–111)
Creatinine, Ser: 0.97 mg/dL (ref 0.61–1.24)
GFR, Estimated: >60 mL/min (ref >60)
Glucose, Bld: 123 mg/dL — ABNORMAL HIGH (ref 70–99)
Potassium: 4.8 mmol/L (ref 3.5–5.1)
Sodium: 138 mmol/L (ref 135–145)
Total Bilirubin: 0.5 mg/dL (ref 0.3–1.2)
Total Protein: 7.4 g/dL (ref 6.5–8.1)

## 2023-05-06 NOTE — ED Provider Notes (Signed)
[facility] Provider Note    Event Date/Time   First MD Initiated Contact with Patient 05/06/23 [phone]     (approximate)   History   Wound Check   HPI Matthew Baxter is a 42 y.o. male who presents for evaluation of a wound check to left foot between the fourth and fifth toes.  He said, and his wife confirmed, that he has had a problem there for more than 10 years.  He said that 1 time he corrected open and it intermittently reopens.  He has never used any sort of ointment or cream on it.  He said his been more painful recently and "pus" has come out of that.  He is able to ambulate but hurts to bear weight.  No recent fever, nausea, vomiting, nor abdominal pain.  No traumatic injuries, no other complaints or concerns.     Physical Exam   Triage Vital Signs: ED Triage Vitals  Encounter Vitals Group     BP 05/05/23 2356 (!) 139/97     Systolic BP Percentile --      Diastolic BP Percentile --      Pulse Rate 05/05/23 2356 (!) 114     Resp 05/05/23 2356 18     Temp 05/05/23 2356 99 F (37.2 C)     Temp Source 05/05/23 2356 Oral     SpO2 05/05/23 2356 99 %     Weight 05/05/23 2357 96.2 kg (212 lb)     Height 05/05/23 2357 1.626 m (5\' 4" )     Head Circumference --      Peak Flow --      Pain Score 05/05/23 2357 7     Pain Loc --      Pain Education --      Exclude from Growth Chart --     Most recent vital signs: Vitals:   05/05/23 2356 05/06/23 0238  BP: (!) 139/97 127/81  Pulse: (!) 114 98  Resp: 18 18  Temp: 99 F (37.2 C) 98.7 F (37.1 C)  SpO2: 99% 100%    General: Awake, no distress.  CV:  Good peripheral perfusion.  Borderline tachycardia which settled down after he came to the room. Resp:  Normal effort. Speaking easily and comfortably, no accessory muscle usage nor intercostal retractions.   Abd:  No distention.  Other:  Patient has essentially normal-appearing feet with no plantar wounds.  However between the fourth toe and little  toe on the left side he has a cracked and open wound that appears moist and consistent with tinea pedis.  No evidence of cellulitis or bacterial infection.  Tender to manipulation of the toes, no foot tenderness, no spreading redness to suggest cellulitis spreading from the wound.   ED Results / Procedures / Treatments   Labs (all labs ordered are listed, but only abnormal results are displayed) Labs Reviewed  COMPREHENSIVE METABOLIC PANEL - Abnormal; Notable for the following components:      Result Value   Glucose, Bld 123 (*)    Calcium 8.8 (*)    All other components within normal limits  CBC WITH DIFFERENTIAL/PLATELET     RADIOLOGY I viewed and interpreted the patient's foot x-rays and there is no evidence of osteomyelitis or other obvious acute abnormality.   PROCEDURES:  Critical Care performed: No  Procedures    IMPRESSION / MDM / ASSESSMENT AND PLAN / ED COURSE  I reviewed the triage vital signs and the nursing notes.  Differential diagnosis includes, but is not limited to, tinea pedis, cellulitis, osteomyelitis, foot ulcer.  Patient's presentation is most consistent with acute complicated illness / injury requiring diagnostic workup.  Labs/studies ordered: CMP, CBC with differential, foot x-rays  Interventions/Medications given:  Medications - No data to display  (Note:  hospital course my include additional interventions and/or labs/studies not listed above.)   Labs all within normal limits.  No acute abnormalities on x-rays.  Initially patient was little bit tachycardic but he is also seems a little bit emotionally upset, has some pressured and rapid speech, and was having some sort of a disagreement with his wife.  His heart rate settled down once he came to the room and I am not concerned that it represents an acute or emergent medical condition.  I advised him that his workup is consistent with athlete's foot and that he  should begin treatment with over-the-counter ointment or cream and follow-up with a podiatrist.  He states that he understands and will do so.      FINAL CLINICAL IMPRESSION(S) / ED DIAGNOSES   Final diagnoses:  Tinea pedis of left foot     Rx / DC Orders   ED Discharge Orders     None        Note:  This document was prepared using Dragon voice recognition software and may include unintentional dictation errors.   Loleta Rose, MD 05/06/23 [phone]

## 2023-05-06 NOTE — Discharge Instructions (Signed)
Please look for any over-the-counter Athlete's Foot cream, ointment, or spray to start treatment (Lotrimin, Tinactin, Lamisil, etc).  Follow up by calling the office of Dr. Logan Bores and scheduling a follow up appointment with a podiatry specialist at the next available opportunity.

## 2023-12-24 ENCOUNTER — Other Ambulatory Visit: Payer: Self-pay

## 2023-12-24 ENCOUNTER — Emergency Department
Admission: EM | Admit: 2023-12-24 | Discharge: 2023-12-24 | Disposition: A | Discharge status: Home / Self Care | Attending: Emergency Medicine | Admitting: Emergency Medicine

## 2023-12-24 DIAGNOSIS — S90121A Contusion of right lesser toe(s) without damage to nail, initial encounter: Secondary | ICD-10-CM | POA: Insufficient documentation

## 2023-12-24 DIAGNOSIS — M79674 Pain in right toe(s): Secondary | ICD-10-CM | POA: Diagnosis present

## 2023-12-24 DIAGNOSIS — W228XXA Striking against or struck by other objects, initial encounter: Secondary | ICD-10-CM | POA: Insufficient documentation

## 2023-12-24 NOTE — ED Provider Notes (Signed)
 [facility] Provider Note    Event Date/Time   First MD Initiated Contact with Patient 12/24/23 [phone]     (approximate)   History   Toe Pain   HPI  Matthew Baxter is a 43 y.o. male with no significant past medical history presents emergency department complaining of right pinky toe pain.  Hit it against a wall and knocked his nail off.  States does not think it is broken because he can move it well.  States really he wants a doctor's note for work because it hurts to put his shoe on.  He does work in a warehouse type area      Physical Exam   Triage Vital Signs: ED Triage Vitals  Encounter Vitals Group     BP 12/24/23 0902 (!) 153/99     Systolic BP Percentile --      Diastolic BP Percentile --      Pulse Rate 12/24/23 0902 (!) 108     Resp 12/24/23 0902 18     Temp 12/24/23 0902 98.2 F (36.8 C)     Temp Source 12/24/23 0902 Oral     SpO2 12/24/23 0902 97 %     Weight 12/24/23 0901 214 lb (97.1 kg)     Height 12/24/23 0901 5\' 5"  (1.651 m)     Head Circumference --      Peak Flow --      Pain Score 12/24/23 0901 5     Pain Loc --      Pain Education --      Exclude from Growth Chart --     Most recent vital signs: Vitals:   12/24/23 0902  BP: (!) 153/99  Pulse: (!) 108  Resp: 18  Temp: 98.2 F (36.8 C)  SpO2: 97%     General: Awake, no distress.   CV:  Good peripheral perfusion. regular rate and  rhythm Resp:  Normal effort.  Abd:  No distention.   Other:  Right fifth toe with nail avulsed, no pus drainage or redness noted, slightly tender to palpation, neurovascular intact   ED Results / Procedures / Treatments   Labs (all labs ordered are listed, but only abnormal results are displayed) Labs Reviewed - No data to display   EKG     RADIOLOGY     PROCEDURES:   Procedures Chief Complaint  Patient presents with   Toe Pain      MEDICATIONS ORDERED IN ED: Medications - No data to display   IMPRESSION /  MDM / ASSESSMENT AND PLAN / ED COURSE  I reviewed the triage vital signs and the nursing notes.                              Differential diagnosis includes, but is not limited to, toe fracture, nail avulsion, cellulitis  Patient's presentation is most consistent with acute, uncomplicated illness.   I did offer to x-ray the toe but patient states he does not feel like it is broken and would like to defer at this time.  His toe was buddy taped by nursing staff.  He was given a work note.  He is to soak the toe in warm water and Epsom salts.  Return if any sign of infection.  He is in agreement treatment plan.  Discharged stable condition.      FINAL CLINICAL IMPRESSION(S) / ED DIAGNOSES   Final diagnoses:  Contusion  of fifth toe of right foot, initial encounter     Rx / DC Orders   ED Discharge Orders     None        Note:  This document was prepared using Dragon voice recognition software and may include unintentional dictation errors.    Delsie Figures, PA-C 12/24/23 1022    Claria Crofts, MD 12/24/23 [phone]

## 2023-12-24 NOTE — ED Triage Notes (Addendum)
 Pt says he stumped his R pinky toe against the wall and knocked the nail off. Pt is here because he wants a doctor's note.
